# Patient Record
Sex: Male | Born: 1945 | Race: White | Hispanic: No | Marital: Single | Smoking: Never smoker
Health system: Southern US, Community
[De-identification: ages and names within clinical notes are randomized; demographics above are authoritative.]

## PROBLEM LIST (undated history)

## (undated) DIAGNOSIS — E785 Hyperlipidemia, unspecified: Secondary | ICD-10-CM

## (undated) DIAGNOSIS — I252 Old myocardial infarction: Secondary | ICD-10-CM

## (undated) DIAGNOSIS — I255 Ischemic cardiomyopathy: Secondary | ICD-10-CM

## (undated) DIAGNOSIS — T7840XA Allergy, unspecified, initial encounter: Secondary | ICD-10-CM

## (undated) DIAGNOSIS — I2510 Atherosclerotic heart disease of native coronary artery without angina pectoris: Secondary | ICD-10-CM

## (undated) DIAGNOSIS — N400 Enlarged prostate without lower urinary tract symptoms: Secondary | ICD-10-CM

## (undated) HISTORY — DX: Hyperlipidemia, unspecified: E78.5

## (undated) HISTORY — DX: Ischemic cardiomyopathy: I25.5

## (undated) HISTORY — DX: Old myocardial infarction: I25.2

## (undated) HISTORY — DX: Atherosclerotic heart disease of native coronary artery without angina pectoris: I25.10

## (undated) HISTORY — DX: Allergy, unspecified, initial encounter: T78.40XA

## (undated) HISTORY — PX: HERNIA REPAIR: SHX51

---

## 2002-12-30 ENCOUNTER — Encounter: Admission: RE | Admit: 2002-12-30 | Discharge: 2002-12-30 | Payer: Self-pay | Admitting: Emergency Medicine

## 2002-12-30 ENCOUNTER — Encounter: Payer: Self-pay | Admitting: Emergency Medicine

## 2011-09-14 ENCOUNTER — Ambulatory Visit (INDEPENDENT_AMBULATORY_CARE_PROVIDER_SITE_OTHER): Payer: Medicare Other | Admitting: Family Medicine

## 2011-09-14 ENCOUNTER — Inpatient Hospital Stay (HOSPITAL_COMMUNITY)
Admission: AD | Admit: 2011-09-14 | Discharge: 2011-09-16 | DRG: 195 | Disposition: A | Discharge status: Home / Self Care | Payer: Medicare Other | Source: Ambulatory Visit | Attending: Family Medicine | Admitting: Family Medicine

## 2011-09-14 ENCOUNTER — Encounter (HOSPITAL_COMMUNITY): Payer: Self-pay | Admitting: Family Medicine

## 2011-09-14 ENCOUNTER — Ambulatory Visit: Payer: Medicare Other

## 2011-09-14 VITALS — BP 101/66 | HR 90 | Temp 97.5°F | Resp 22 | Ht 67.5 in | Wt 142.6 lb

## 2011-09-14 DIAGNOSIS — Z79899 Other long term (current) drug therapy: Secondary | ICD-10-CM

## 2011-09-14 DIAGNOSIS — R0902 Hypoxemia: Secondary | ICD-10-CM | POA: Diagnosis present

## 2011-09-14 DIAGNOSIS — J189 Pneumonia, unspecified organism: Secondary | ICD-10-CM

## 2011-09-14 DIAGNOSIS — J019 Acute sinusitis, unspecified: Secondary | ICD-10-CM

## 2011-09-14 DIAGNOSIS — N400 Enlarged prostate without lower urinary tract symptoms: Secondary | ICD-10-CM | POA: Diagnosis present

## 2011-09-14 DIAGNOSIS — R0600 Dyspnea, unspecified: Secondary | ICD-10-CM

## 2011-09-14 DIAGNOSIS — J329 Chronic sinusitis, unspecified: Secondary | ICD-10-CM | POA: Diagnosis present

## 2011-09-14 DIAGNOSIS — E860 Dehydration: Secondary | ICD-10-CM | POA: Diagnosis present

## 2011-09-14 HISTORY — DX: Benign prostatic hyperplasia without lower urinary tract symptoms: N40.0

## 2011-09-14 LAB — DIFFERENTIAL
Basophils Relative: 0 % (ref 0–1)
Lymphs Abs: 0.7 10*3/uL (ref 0.7–4.0)
Monocytes Absolute: 1.1 10*3/uL — ABNORMAL HIGH (ref 0.1–1.0)
Monocytes Relative: 8 % (ref 3–12)
Neutro Abs: 12.7 10*3/uL — ABNORMAL HIGH (ref 1.7–7.7)

## 2011-09-14 LAB — COMPREHENSIVE METABOLIC PANEL
ALT: 32 U/L (ref 0–53)
Albumin: 2.7 g/dL — ABNORMAL LOW (ref 3.5–5.2)
Alkaline Phosphatase: 88 U/L (ref 39–117)
BUN: 10 mg/dL (ref 6–23)
Chloride: 97 mEq/L (ref 96–112)
GFR calc Af Amer: >90 mL/min (ref >90)
Glucose, Bld: 105 mg/dL — ABNORMAL HIGH (ref 70–99)
Potassium: 3.2 mEq/L — ABNORMAL LOW (ref 3.5–5.1)
Total Bilirubin: 0.6 mg/dL (ref 0.3–1.2)

## 2011-09-14 LAB — POCT CBC
HCT, POC: 40.4 % — AB (ref 43.5–53.7)
Hemoglobin: 13.2 g/dL — AB (ref 14.1–18.1)
MCHC: 32.7 g/dL (ref 31.8–35.4)
MCV: 92.6 fL (ref 80–97)
MID (cbc): 0.6 (ref 0–0.9)
RBC: 4.36 M/uL — AB (ref 4.69–6.13)
RDW, POC: 13.3 %
WBC: 11.2 10*3/uL — AB (ref 4.6–10.2)

## 2011-09-14 LAB — RAPID HIV SCREEN (WH-MAU): Rapid HIV Screen: NONREACTIVE

## 2011-09-14 LAB — MAGNESIUM: Magnesium: 2 mg/dL (ref 1.5–2.5)

## 2011-09-14 MED ORDER — ALBUTEROL SULFATE (5 MG/ML) 0.5% IN NEBU
2.5000 mg | INHALATION_SOLUTION | RESPIRATORY_TRACT | Status: DC
  Administered 2011-09-14 – 2011-09-16 (×8): 2.5 mg via RESPIRATORY_TRACT
  Filled 2011-09-14 (×9): qty 0.5

## 2011-09-14 MED ORDER — IBUPROFEN 400 MG PO TABS
400.0000 mg | ORAL_TABLET | Freq: Four times a day (QID) | ORAL | Status: DC | PRN
  Filled 2011-09-14: qty 1

## 2011-09-14 MED ORDER — ALBUTEROL SULFATE (2.5 MG/3ML) 0.083% IN NEBU: 2.5000 mg | INHALATION_SOLUTION | Freq: Once | RESPIRATORY_TRACT | Status: DC

## 2011-09-14 MED ORDER — FLUTICASONE PROPIONATE 50 MCG/ACT NA SUSP
2.0000 | Freq: Every day | NASAL | Status: DC
  Administered 2011-09-14 – 2011-09-16 (×3): 2 via NASAL
  Filled 2011-09-14: qty 16

## 2011-09-14 MED ORDER — DEXTROSE 5 % IV SOLN
500.0000 mg | INTRAVENOUS | Status: DC
  Administered 2011-09-14 – 2011-09-15 (×2): 500 mg via INTRAVENOUS
  Filled 2011-09-14 (×3): qty 500

## 2011-09-14 MED ORDER — IPRATROPIUM BROMIDE 0.02 % IN SOLN
0.5000 mg | RESPIRATORY_TRACT | Status: DC
  Administered 2011-09-14 – 2011-09-16 (×8): 0.5 mg via RESPIRATORY_TRACT
  Filled 2011-09-14 (×9): qty 2.5

## 2011-09-14 MED ORDER — CLONAZEPAM 1 MG PO TABS: 1.0000 mg | ORAL_TABLET | Freq: Every evening | ORAL | Status: DC | PRN

## 2011-09-14 MED ORDER — ALBUTEROL SULFATE (2.5 MG/3ML) 0.083% IN NEBU
2.5000 mg | INHALATION_SOLUTION | Freq: Once | RESPIRATORY_TRACT | Status: AC
  Administered 2011-09-14 (×2): 2.5 mg via RESPIRATORY_TRACT

## 2011-09-14 MED ORDER — ALBUTEROL SULFATE (5 MG/ML) 0.5% IN NEBU: 2.5000 mg | INHALATION_SOLUTION | RESPIRATORY_TRACT | Status: DC | PRN

## 2011-09-14 MED ORDER — AMPHETAMINE-DEXTROAMPHETAMINE 15 MG PO TABS
15.0000 mg | ORAL_TABLET | Freq: Three times a day (TID) | ORAL | Status: DC
  Filled 2011-09-14 (×7): qty 1

## 2011-09-14 MED ORDER — LORATADINE 10 MG PO TABS
10.0000 mg | ORAL_TABLET | Freq: Every day | ORAL | Status: DC
  Administered 2011-09-14 – 2011-09-16 (×3): 10 mg via ORAL
  Filled 2011-09-14 (×3): qty 1

## 2011-09-14 MED ORDER — TERAZOSIN HCL 2 MG PO CAPS
2.0000 mg | ORAL_CAPSULE | Freq: Every day | ORAL | Status: DC
  Administered 2011-09-14 – 2011-09-15 (×2): 2 mg via ORAL
  Filled 2011-09-14 (×3): qty 1

## 2011-09-14 MED ORDER — GUAIFENESIN ER 600 MG PO TB12
600.0000 mg | ORAL_TABLET | Freq: Two times a day (BID) | ORAL | Status: DC
  Administered 2011-09-14 – 2011-09-16 (×4): 600 mg via ORAL
  Filled 2011-09-14 (×5): qty 1

## 2011-09-14 MED ORDER — SODIUM CHLORIDE 0.9 % IV SOLN
INTRAVENOUS | Status: DC
  Administered 2011-09-14 – 2011-09-15 (×2): via INTRAVENOUS

## 2011-09-14 MED ORDER — CEFTRIAXONE SODIUM 1 G IJ SOLR
1.0000 g | INTRAMUSCULAR | Status: DC
  Administered 2011-09-14 – 2011-09-15 (×2): 1 g via INTRAVENOUS
  Filled 2011-09-14 (×3): qty 10

## 2011-09-14 NOTE — Progress Notes (Signed)
  Subjective: Patient has been having a lot of trouble with upper respiratory congestion for 10 days. He has not been febrile. He had bad sinus symptoms have gotten progressively weaker. More cough. Coughing up some phlegm. Just feels weak and tired. Sleeping a lot.  Has never been hospitalized. It is usually quite healthy. He worked various jobs throughout his life. Has no children, has a regular girlfriend. Does not smoke. Exercises. History of Wellbutrin allergy.  Objective: TMs have fluid behind both drums, left more clear than right. Neck supple without nodes. Chest has some decreased breath sounds bilaterally and a few crackles at the bases. Not much in the way of wheezes. He does look short of breath. Splinting himself. Sitting up to breathe. It was clear. No edema.  Results for orders placed in visit on 09/14/11  POCT CBC      Component Value Range   WBC 11.2 (*) 4.6 - 10.2 (K/uL)   Lymph, poc 1.6  0.6 - 3.4    POC LYMPH PERCENT 14.4  10 - 50 (%L)   MID (cbc) 0.6  0 - 0.9    POC MID % 5.7  0 - 12 (%M)   POC Granulocyte 8.9 (*) 2 - 6.9    Granulocyte percent 79.9  37 - 80 (%G)   RBC 4.36 (*) 4.69 - 6.13 (M/uL)   Hemoglobin 13.2 (*) 14.1 - 18.1 (g/dL)   HCT, POC 40.9 (*) 81.1 - 53.7 (%)   MCV 92.6  80 - 97 (fL)   MCH, POC 30.3  27 - 31.2 (pg)   MCHC 32.7  31.8 - 35.4 (g/dL)   RDW, POC 91.4     Platelet Count, POC 443 (*) 142 - 424 (K/uL)   MPV 8.4  0 - 99.8 (fL)   [facility] reading (PRIMARY) by  Dr. Alwyn Ren chest x-ray is abnormal. Increased retrosternal air space, and abnormal right lower lung raise some suspicion of a pneumothorax or some other abnormal air trapping.  Assessment sinusitis, serous otitis, and asthma.  Plan await radiology chest x-ray reading. Give patient a breathing treatment. His O2 sat is only 88%.  Sat persists 87-88 despite albuterol neb. . Assessment: Pneumonia Hypoxemia Sinusitis  Patient will be sent to the hospital on 02. He needs admission. Will  start IV and O2.  Spoke to Dr.Konkol counting teaching service who will try and get him a bed. Will need to send him by EMS non-emergent transfer.

## 2011-09-14 NOTE — H&P (Signed)
Dustin Gardner is an 66 y.o. male.    PCP: [facility] Chief Complaint: shortness of breath, fatigue  HPI: A 66 year old male with history of sinusitis presents with 10 days of sinus congestion and productive cough. Approximately 7 days ago patient began to experience some dyspnea and worsening of cough. Also having some chills, fatigue, poor oral intake, and headaches. Presented to urgent care at [facility] today and found to have a mild resting hypoxia of 87-88% on room air and persisted post nebulizer treatment. Chest x-ray concerning for lower lobe pneumonia and leukocytosis of 12. Patient stabilized to 93% on 2 L oxygen. Transferred to [facility] for admission due to increase work of breathing and hypoxia.  Review of systems. Patient endorses fatigue, chills, lightheadedness. Denies fever, edema, rash, visual changes, wheezing, chest pain, constipation, diarrhea, urinary problems.  Past Medical History  Diagnosis Date  . BPH (benign prostatic hyperplasia)   . Chronic sinusitis     Past Surgical History  Procedure Date  . Hernia repair   knee surgery 1979  Family History  Problem Relation Age of Onset  . Cancer Mother     lung   Social History:  reports that he has never smoked. He does not have any smokeless tobacco history on file. He reports that he drinks about 7 ounces of alcohol per week. He reports that he does not use illicit drugs.  Allergies:  Allergies  Allergen Reactions  . Wellbutrin (Bupropion Hcl) Rash    Medications Prior to Admission  Medication Dose Route Frequency Provider Last Rate Last Dose  . 0.9 %  sodium chloride infusion   Intravenous Continuous Lloyd Huger, MD      . albuterol (PROVENTIL) (5 MG/ML) 0.5% nebulizer solution 2.5 mg  2.5 mg Nebulization Q4H PRN Lloyd Huger, MD      . ipratropium (ATROVENT) nebulizer solution 0.5 mg  0.5 mg Nebulization Q4H Lloyd Huger, MD       And  . albuterol (PROVENTIL) (5 MG/ML) 0.5% nebulizer solution 2.5 mg  2.5 mg  Nebulization Q4H Lloyd Huger, MD      . amphetamine-dextroamphetamine (ADDERALL) tablet 15 mg  15 mg Oral TID Lloyd Huger, MD      . azithromycin (ZITHROMAX) 500 mg in dextrose 5 % 250 mL IVPB  500 mg Intravenous Q24H Lloyd Huger, MD      . cefTRIAXone (ROCEPHIN) 1 g in dextrose 5 % 50 mL IVPB  1 g Intravenous Q24H Lloyd Huger, MD      . clonazePAM (KLONOPIN) tablet 1 mg  1 mg Oral QHS PRN Lloyd Huger, MD      . fluticasone (FLONASE) 50 MCG/ACT nasal spray 2 spray  2 spray Each Nare Daily Lloyd Huger, MD      . guaiFENesin (MUCINEX) 12 hr tablet 600 mg  600 mg Oral BID Lloyd Huger, MD      . ibuprofen (ADVIL,MOTRIN) tablet 400 mg  400 mg Oral Q6H PRN Lloyd Huger, MD      . loratadine (CLARITIN) tablet 10 mg  10 mg Oral Daily Lloyd Huger, MD      . terazosin (HYTRIN) capsule 2 mg  2 mg Oral QHS Lloyd Huger, MD       No current outpatient prescriptions on file as of 09/14/2011.    Results for orders placed in visit on 09/14/11 (from the past 48 hour(s))  POCT CBC     Status: Abnormal   Collection Time   09/14/11  3:41 PM  Component Value Range Comment   WBC 11.2 (*) 4.6 - 10.2 (K/uL)    Lymph, poc 1.6  0.6 - 3.4     POC LYMPH PERCENT 14.4  10 - 50 (%L)    MID (cbc) 0.6  0 - 0.9     POC MID % 5.7  0 - 12 (%M)    POC Granulocyte 8.9 (*) 2 - 6.9     Granulocyte percent 79.9  37 - 80 (%G)    RBC 4.36 (*) 4.69 - 6.13 (M/uL)    Hemoglobin 13.2 (*) 14.1 - 18.1 (g/dL)    HCT, POC 16.1 (*) 09.6 - 53.7 (%)    MCV 92.6  80 - 97 (fL)    MCH, POC 30.3  27 - 31.2 (pg)    MCHC 32.7  31.8 - 35.4 (g/dL)    RDW, POC 04.5      Platelet Count, POC 443 (*) 142 - 424 (K/uL)    MPV 8.4  0 - 99.8 (fL)    Dg Chest 2 View  09/14/2011  OVERREAD BY [facility] *RADIOLOGY REPORT*  Clinical Data: Shortness of breath.  CHEST - 2 VIEW  Comparison: None.  Findings: There is mild patient rotation to the left.  There is a calcified right hilar lymph node.  There is basilar air space disease on the lateral  view with partial obscuration of the hemidiaphragm posteriorly.  Based on the frontal examination, there may be involvement of both lower lobes.  No significant pleural effusion is identified.  On the lateral view, there is a probable small retrosternal granuloma superiorly.  No acute osseous findings are seen. There is no evidence of pneumothorax.  IMPRESSION:  1.  Bilateral lower lobe air space opacities concerning for pneumonia.  Radiographic followup is recommended to document clearing. 2.  Evidence of prior granulomatous disease with calcified right hilar lymph node and upper lobe granuloma.  Original Report Authenticated By: Gerrianne Scale, M.D.    ROS  Blood pressure 158/83, pulse 109, temperature 99.2 F (37.3 C), temperature source Oral, resp. rate 20, height 5\' 7"  (1.702 m), weight 144 lb (65.318 kg), SpO2 92.00%. Physical Exam  Vitals reviewed. Constitutional: He is oriented to person, place, and time. He appears well-developed and well-nourished.       Mild increase work of breathing. Pauses every 5-6 words.  HENT:  Head: Normocephalic and atraumatic.  Mouth/Throat: No oropharyngeal exudate.       Dry mm.  Eyes: EOM are normal. Pupils are equal, round, and reactive to light.  Cardiovascular: Regular rhythm, normal heart sounds and intact distal pulses.   No murmur heard.      Tachycardia  Respiratory: He is in respiratory distress. He has wheezes.       Tachypnea, Mild abdominal breathing and supraclavicular retraction. Mildly diminshed BS in bases with rare expiratory wheeze.   GI: Soft. There is no tenderness.  Musculoskeletal: He exhibits no edema and no tenderness.  Neurological: He is alert and oriented to person, place, and time. No cranial nerve deficit. He exhibits normal muscle tone. Coordination normal.  Skin: No rash noted.  Psychiatric: He has a normal mood and affect.     Assessment/Plan A 66 year old male with community-acquired pneumonia.  1. Pneumonia.  Chest x-ray concerning for a lower lobe involvement. Resting hypoxia and leukocytosis. Given diffuse diminished breath sounds and some evidence of bronchospasm Will treat for typical and atypical organisms with Rocephin and azithromycin IV. Duonebs every 4 hours scheduled. Will reassess  her after initiation of antibiotics and consider starting systemic steroids if bronchospasm persists. Mucinex for cough. Blood cultures and urinary antigens ordered.   2. Tachycardia. Likely effects of albuterol and possible mild dehydration. Will start on normal saline at 50 cc per hour and allow diet as patient is hungry. EKG in a.m. No known cardiac history  3. BPH. Continue home med terazosyn.  4. FEN. Regular diet and normal saline at 50 cc per hour. Electrolytes ordered.  5. PPX. SCDs. Endorses 2 alcoholic beverages per night but denies history of withdrawal and only drinking infrequently this past week. Low likelihood of withdrawal but will monitor.  6. Dispo. Pending clinical course. Admit to telemetry tonight. Patient is full code status.   Dameion Briles 09/14/2011, 8:40 PM PGY-2  Pager [phone]

## 2011-09-14 NOTE — Progress Notes (Signed)
  Subjective:    Patient ID: Dustin Gardner, male    DOB: 1946-02-09, 66 y.o.   MRN: [id number]  HPI    Review of Systems     Objective:   Physical Exam        Assessment & Plan:

## 2011-09-15 ENCOUNTER — Other Ambulatory Visit: Payer: Self-pay

## 2011-09-15 ENCOUNTER — Encounter: Payer: Self-pay | Admitting: Radiology

## 2011-09-15 DIAGNOSIS — J159 Unspecified bacterial pneumonia: Secondary | ICD-10-CM

## 2011-09-15 LAB — COMPREHENSIVE METABOLIC PANEL
ALT: 37 U/L (ref 0–53)
AST: 31 U/L (ref 0–37)
Alkaline Phosphatase: 73 U/L (ref 39–117)
BUN: 13 mg/dL (ref 6–23)
Calcium: 9.1 mg/dL (ref 8.4–10.5)
Chloride: 99 mEq/L (ref 96–112)
Glucose, Bld: 105 mg/dL — ABNORMAL HIGH (ref 70–99)
Potassium: 3.6 mEq/L (ref 3.5–5.3)
Sodium: 138 mEq/L (ref 135–145)
Total Bilirubin: 0.8 mg/dL (ref 0.3–1.2)

## 2011-09-15 LAB — CBC
HCT: 31.9 % — ABNORMAL LOW (ref 39.0–52.0)
Hemoglobin: 11.2 g/dL — ABNORMAL LOW (ref 13.0–17.0)
MCH: 31.2 pg (ref 26.0–34.0)
MCHC: 35.1 g/dL (ref 30.0–36.0)
MCV: 88.9 fL (ref 78.0–100.0)

## 2011-09-15 LAB — LEGIONELLA ANTIGEN, URINE

## 2011-09-15 LAB — STREP PNEUMONIAE URINARY ANTIGEN: Strep Pneumo Urinary Antigen: NEGATIVE

## 2011-09-15 MED ORDER — POTASSIUM CHLORIDE 20 MEQ PO PACK
40.0000 meq | PACK | ORAL | Status: DC
  Filled 2011-09-15 (×2): qty 2

## 2011-09-15 MED ORDER — POTASSIUM CHLORIDE CRYS ER 20 MEQ PO TBCR
40.0000 meq | EXTENDED_RELEASE_TABLET | ORAL | Status: AC
  Administered 2011-09-15: 40 meq via ORAL
  Filled 2011-09-15: qty 2

## 2011-09-15 MED ORDER — GUAIFENESIN 200 MG PO TABS
200.0000 mg | ORAL_TABLET | ORAL | Status: DC | PRN
  Filled 2011-09-15: qty 1

## 2011-09-15 NOTE — Progress Notes (Signed)
FMTS Attending Daily Note: Krystle Polcyn MD [phone] pager office [phone] I have discussed this patient with the resident and reviewed the assessment and plan as documented above. I agree wit the resident's findings and plan.  

## 2011-09-15 NOTE — Progress Notes (Signed)
Family Medicine Teaching Service Daily Resident Note   Patient name: Dustin Gardner Medical record number: [id number] Date of birth: 11-Aug-1945 Age: 66 y.o. Gender: male Length of Stay:  LOS: 1 day             SUBJECTIVE & OVERNIGHT EVENTS  Pt reports that cough and dyspnea improved but still significant.             OBJECTIVE  Vitals: Patient Vitals for the past 24 hrs:  BP Temp Temp src Pulse Resp SpO2 Height Weight  09/15/11 1340 124/73 mmHg 97.7 F (36.5 C) Oral 91  20  93 % - -  09/15/11 0742 - - - - - 93 % - -  09/15/11 0544 108/65 mmHg 98.8 F (37.1 C) - 98  20  92 % - -  09/14/11 2144 117/68 mmHg 99.7 F (37.6 C) - 101  20  93 % - -  09/14/11 1847 158/83 mmHg 99.2 F (37.3 C) Oral 109  20  92 % 5\' 7"  (1.702 m) 144 lb (65.318 kg)   Wt Readings from Last 3 Encounters:  09/14/11 144 lb (65.318 kg)  09/14/11 142 lb 9.6 oz (64.683 kg)    Intake/Output Summary (Last 24 hours) at 09/15/11 1432 Last data filed at 09/15/11 1300  Gross per 24 hour  Intake   1240 ml  Output    601 ml  Net    639 ml    PE: GENERAL:  Adult Caucasian Male, examined in [facility] 5500.  In no discomfort, mild resp distress with slight conversational dyspnea.  Able to maintain ~10 words without O2 in place. HNEENT: AT/[place], MMM, no scleral icterus, EOMi THORAX: HEART: RRR, S1/S2 heard, no murmur LUNGS: Decreased breath sounds in B bases, Slight end expiratory wheezing, no tactile fremitus ABDOMEN:  +BS, soft, non-tender, no rigidity, no guarding, no masses/organomegaly EXTREMITIES: Moves all 4 extremities spontaneously, warm well perfused, no edema, bilateral DP and PT pulses 2/4.    LABS: Hematology:  Lab 09/15/11 0548 09/14/11 1541  WBC 15.2* 11.2*  HGB 11.2* 13.2*  HCT 31.9* 40.4*  PLT 395 --  RDW 12.8 --  MCV 88.9 92.6  MCHC 35.1 32.7    Lab 09/14/11 2045  LYMPHSABS 0.7  EOSABS 0.0  BASOSABS 0.0    Metabolic:  Lab 09/14/11 2045 09/14/11 1500  NA 132* 138  K 3.2* 3.6  CL 97 99    CO2 21 24  BUN 10 13  CREATININE 0.74 0.86  GLUCOSE 105* 105*  CALCIUM 8.5 9.1  MG 2.0 --  PHOS 2.7 --    Lab 09/14/11 2045 09/14/11 1500  AST 23 31  ALKPHOS 88 73  BILITOT 0.6 0.8  BILIDIR -- --  PROT 6.9 7.1  ALBUMIN 2.7* 3.8  PREALBUMIN -- --   PENDING LABS:   MICRO: 3/7: Blood Cx - NGTD HIV Screen - Negative Urine Strep - Negative Urine Legionella - negative  IMAGING: None new  MEDS:    . ipratropium  0.5 mg Nebulization Q4H   And  . albuterol  2.5 mg Nebulization Q4H  . amphetamine-dextroamphetamine  15 mg Oral TID  . azithromycin  500 mg Intravenous Q24H  . cefTRIAXone (ROCEPHIN)  IV  1 g Intravenous Q24H  . fluticasone  2 spray Each Nare Daily  . guaiFENesin  600 mg Oral BID  . loratadine  10 mg Oral Daily  . potassium chloride  40 mEq Oral Q4H  . terazosin  2 mg Oral QHS  .  DISCONTD: potassium chloride  40 mEq Oral Q4H   albuterol, clonazePAM, guaiFENesin, ibuprofen            ASSESSMENT & PLAN  A 66 year old male with community-acquired pneumonia.  1. RESP (Community Acquired Pneumonia, Hypoxia)  *Fluticasone *Albuterol + Ipratropium *Guaifenesin *Loratadine - RESP toilet - Continue to provide Story O2 to maintain O2 > 92% as no underlying COPD and remains symptomatic - ABX as below  2. ID (CAP, Leukocytosis) - Remains afebrile *Azithromycin *Rocephin Plan to de-esclate to Moxifloxacin Blood Cx NGTD and Urinary antigens negative  3. CV (Tachycardia)  - likely secondary to mild dehydration and albuterol - improved overnight  4. GU (BPH) *Terazosyn  5. PSYCH *Clonazepam prn  --- IVFs: NS 31ml/hr --- DIET: Regular  --- PPx: SCDs            DISPOSITION  Will continue to observe overnight as pt still requring O2.  Will plan for D/c in AM if clinically improved.    Andrena Mews, DO [facility] Resident - PGY-1 09/15/2011 2:32 PM

## 2011-09-15 NOTE — H&P (Signed)
Family Medicine Teaching Service Attending Note  I interviewed and examined patient Enderle and reviewed their tests and x-rays.  I discussed with Dr. Cristal Ford and reviewed their note for today.  I agree with their assessment and plan.     Additionally  This AM was resting comfortably wearing 02 Consistent with a CAP.  Treat inpatient until able to oxyenate off O2 and VS are stable.   If he remains hypoxic for longer than 1-2 days we may need to investigate other causes (chronic lung disease, pulmonary emboli etc)

## 2011-09-15 NOTE — Progress Notes (Signed)
UR completed. Hoy Fallert RNBSN 

## 2011-09-16 ENCOUNTER — Inpatient Hospital Stay (HOSPITAL_COMMUNITY): Payer: Medicare Other

## 2011-09-16 MED ORDER — MOXIFLOXACIN HCL 400 MG PO TABS: 400.0000 mg | ORAL_TABLET | Freq: Every day | ORAL | Status: AC

## 2011-09-16 NOTE — Discharge Summary (Signed)
Family Medicine Teaching Service  Discharge Note : Attending Makaila Windle MD Pager [phone] Office [phone] I have seen and examined this patient, reviewed their chart and discussed discharge planning wit the resident at the time of discharge. I agree with the discharge plan as above.  

## 2011-09-16 NOTE — Discharge Summary (Signed)
Physician Discharge Summary  Patient ID: Dustin Gardner MRN: [id number] DOB/AGE: 1945-07-18 66 y.o.  Admit date: 09/14/2011 Discharge date: 09/16/2011  Admission Diagnoses:  Discharge Diagnoses:  Principal Problem:  *CAP (community acquired pneumonia)   Discharged Condition: good, improved  Hospital Course:  66 y/o c/m, with history of sinusitis presented with 10 days of sinus congestion and productive cough. Approximately 7 days ago patient began to experience some dyspnea and worsening of cough. Also having some chills, fatigue, poor oral intake, and headaches. Presented to urgent care at [facility] and found to have a mild resting hypoxia of 87-88% on room air and persisted post nebulizer treatment. Chest x-ray concerning for lower lobe pneumonia and leukocytosis of 12. Patient stabilized to 93% on 2 L oxygen. Transferred to [facility] for admission due to increase work of breathing and hypoxia. Found to have pneumonia on chest xray in bilateral lower lobes. His WBC was 15 at discharge. He did not have any fever or vital instability. Patient had no SOB or breathing difficulty. He was off oxygen the day of discharge, lowest 02 was 87%. Patient did have a cough at discharge. He felt better and was in stable at home condition for discharge.  Patient also received IV fluids for dehydration, which clinically resolved.  Consults: None  Significant Diagnostic Studies: radiology: CXR: infiltrates: lower lobe bilaterally Lab Results  Component Value Date   WBC 15.2* 09/15/2011   HGB 11.2* 09/15/2011   HCT 31.9* 09/15/2011   MCV 88.9 09/15/2011   PLT 395 09/15/2011    Treatments: IV hydration and antibiotics: ceftriaxone and azithromycin  Discharge Exam: Blood pressure 100/64, pulse 86, temperature 98.8 F (37.1 C), temperature source Oral, resp. rate 20, height 5\' 7"  (1.702 m), weight 144 lb (65.318 kg), SpO2 90.00%. General appearance: alert, cooperative, appears stated age and no distress Resp: diminished  breath sounds LLL and RLL Cardio: regular rate and rhythm, S1, S2 normal, no murmur, click, rub or gallop Extremities: extremities normal, atraumatic, no cyanosis or edema Skin: Skin color, texture, turgor normal. No rashes or lesions  Disposition: Home with help from friend.    Medication List  As of 09/16/2011 11:40 AM   TAKE these medications         amphetamine-dextroamphetamine 15 MG tablet   Commonly known as: ADDERALL   Take 15 mg by mouth 3 (three) times daily.      clonazePAM 1 MG tablet   Commonly known as: KLONOPIN   Take 1 mg by mouth at bedtime as needed.      fluticasone 50 MCG/ACT nasal spray   Commonly known as: FLONASE   Place 2 sprays into the nose daily.      guaiFENesin 600 MG 12 hr tablet   Commonly known as: MUCINEX   Take 600 mg by mouth 2 (two) times daily.      ibuprofen 200 MG tablet   Commonly known as: ADVIL,MOTRIN   Take 400 mg by mouth every 6 (six) hours as needed. For pain      loratadine 10 MG tablet   Commonly known as: CLARITIN   Take 10 mg by mouth daily.      moxifloxacin 400 MG tablet   Commonly known as: AVELOX   Take 1 tablet (400 mg total) by mouth daily.      sodium chloride 0.65 % nasal spray   Commonly known as: OCEAN   Place 1 spray into the nose as needed. For congestion      terazosin  2 MG capsule   Commonly known as: HYTRIN   Take 2 mg by mouth at bedtime.           Follow-up Information    Schedule an appointment as soon as possible for a visit with HOPPER,DAVID, MD.   Contact information:   [address] [place] [address] [phone]          Signed: Orson Gear 09/16/2011, 11:40 AM

## 2011-09-16 NOTE — Progress Notes (Signed)
Patient being discharged today. IV site removed. Belongings bagged for discharge. Discharge instructions reviewed with patient. Taylan Marez,RN.

## 2011-09-18 ENCOUNTER — Telehealth: Payer: Self-pay

## 2011-09-18 NOTE — Telephone Encounter (Signed)
Dr Alwyn Ren saw the patient last and sent him to the hospital for pneumonia.  Patient was told to follow up with our office and wants to see Dr. Alwyn Ren, however he does not want to see him at the walk in center and would like to talk to Dr. Alwyn Ren and our medical director.

## 2011-09-18 NOTE — Telephone Encounter (Signed)
Patient can go to the appointment Center and see either Dr. Albin Fischer or one of the PAs, but I do not have any appointment hours. If he wishes to see me he has to come back in here and ask for me.  I am sorry, but that is my schedule.

## 2011-09-18 NOTE — Telephone Encounter (Signed)
PLEASE ADVISE.

## 2011-09-19 NOTE — Telephone Encounter (Signed)
LMOM notifying patient message.  CB if ?'s.

## 2011-09-20 ENCOUNTER — Ambulatory Visit (INDEPENDENT_AMBULATORY_CARE_PROVIDER_SITE_OTHER): Payer: Medicare Other | Admitting: Family Medicine

## 2011-09-20 VITALS — BP 94/64 | HR 93 | Temp 98.0°F | Resp 16 | Ht 67.0 in | Wt 141.4 lb

## 2011-09-20 DIAGNOSIS — J310 Chronic rhinitis: Secondary | ICD-10-CM

## 2011-09-20 DIAGNOSIS — J189 Pneumonia, unspecified organism: Secondary | ICD-10-CM

## 2011-09-20 DIAGNOSIS — R5381 Other malaise: Secondary | ICD-10-CM

## 2011-09-20 DIAGNOSIS — R5383 Other fatigue: Secondary | ICD-10-CM

## 2011-09-20 DIAGNOSIS — J159 Unspecified bacterial pneumonia: Secondary | ICD-10-CM

## 2011-09-20 DIAGNOSIS — N400 Enlarged prostate without lower urinary tract symptoms: Secondary | ICD-10-CM

## 2011-09-20 LAB — CULTURE, BLOOD (ROUTINE X 2)

## 2011-09-20 MED ORDER — TERAZOSIN HCL 2 MG PO CAPS: 2.0000 mg | ORAL_CAPSULE | Freq: Every day | ORAL | Status: DC

## 2011-09-20 MED ORDER — FLUTICASONE PROPIONATE 50 MCG/ACT NA SUSP: 2.0000 | Freq: Every day | NASAL | Status: DC

## 2011-09-20 NOTE — Progress Notes (Signed)
Subjective: Patient was hospitalized at [facility] with a pneumonia last week. He is doing better though still short of breath. We talked about how it takes time to recover. His O2 saturation today was 93%. He asked that he get home O2, and told him he would not qualify. He asked if it would make him get well quicker, told him it should not make a difference in the recovery time at this point he needs to continue taking his antibiotics.  Objective:  Still looks weak and a little short of breath. Throat clear. Neck supple without nodes. Chest is slightly congested sounding but no clearcut rales rhonchi or wheezes. Heart regular without murmurs.  Assessment: Pneumonia, partially treated Post illness weakness  Plan: At his questions. We'll see him back in followup as needed or in about a month.  In trying to send a letter to Dr. Madaline Guthrie per his request.

## 2011-09-20 NOTE — Progress Notes (Deleted)
  Subjective:    Patient ID: Dustin Gardner, male    DOB: 12/03/1945, 65 y.o.   MRN: [id number]  HPI    Review of Systems     Objective:   Physical Exam        Assessment & Plan:   

## 2011-09-20 NOTE — Patient Instructions (Signed)
Continue to gradually increase her exercise. I encouraged deep breathing and coughing out of the phlegm.  If more shortness of breath or fevers recur come on in right away.  If the  general weakness has not improved over the next couple of weeks come back for a recheck.

## 2011-09-21 LAB — CULTURE, BLOOD (ROUTINE X 2)
Culture  Setup Time: 201303080236
Culture: NO GROWTH

## 2012-10-09 ENCOUNTER — Ambulatory Visit (INDEPENDENT_AMBULATORY_CARE_PROVIDER_SITE_OTHER): Payer: Medicare Other | Admitting: Family Medicine

## 2012-10-09 VITALS — BP 128/64 | HR 69 | Temp 97.3°F | Resp 16 | Ht 66.75 in | Wt 144.2 lb

## 2012-10-09 DIAGNOSIS — Z0000 Encounter for general adult medical examination without abnormal findings: Secondary | ICD-10-CM

## 2012-10-09 DIAGNOSIS — J309 Allergic rhinitis, unspecified: Secondary | ICD-10-CM

## 2012-10-09 DIAGNOSIS — F909 Attention-deficit hyperactivity disorder, unspecified type: Secondary | ICD-10-CM

## 2012-10-09 DIAGNOSIS — Z860100 Personal history of colon polyps, unspecified: Secondary | ICD-10-CM

## 2012-10-09 DIAGNOSIS — J310 Chronic rhinitis: Secondary | ICD-10-CM

## 2012-10-09 DIAGNOSIS — N400 Enlarged prostate without lower urinary tract symptoms: Secondary | ICD-10-CM

## 2012-10-09 LAB — POCT CBC
HCT, POC: 42 % — AB (ref 43.5–53.7)
Lymph, poc: 1.5 (ref 0.6–3.4)
MCH, POC: 30.3 pg (ref 27–31.2)
MCHC: 31.9 g/dL (ref 31.8–35.4)
MCV: 95.1 fL (ref 80–97)
MID (cbc): 0.3 (ref 0–0.9)
POC LYMPH PERCENT: 33.5 %L (ref 10–50)
Platelet Count, POC: 210 10*3/uL (ref 142–424)
RDW, POC: 13.3 %
WBC: 4.4 10*3/uL — AB (ref 4.6–10.2)

## 2012-10-09 LAB — LIPID PANEL
Cholesterol: 183 mg/dL (ref 0–200)
Total CHOL/HDL Ratio: 3.3 Ratio
Triglycerides: 60 mg/dL (ref <150)
VLDL: 12 mg/dL (ref 0–40)

## 2012-10-09 LAB — COMPREHENSIVE METABOLIC PANEL
CO2: 28 mEq/L (ref 19–32)
Calcium: 9.3 mg/dL (ref 8.4–10.5)
Chloride: 107 mEq/L (ref 96–112)
Creat: 1.02 mg/dL (ref 0.50–1.35)
Glucose, Bld: 102 mg/dL — ABNORMAL HIGH (ref 70–99)
Total Bilirubin: 0.6 mg/dL (ref 0.3–1.2)

## 2012-10-09 MED ORDER — FLUTICASONE PROPIONATE 50 MCG/ACT NA SUSP: 2.0000 | Freq: Every day | NASAL | Status: DC

## 2012-10-09 MED ORDER — TERAZOSIN HCL 2 MG PO CAPS: 2.0000 mg | ORAL_CAPSULE | Freq: Every day | ORAL | Status: DC

## 2012-10-09 NOTE — Progress Notes (Signed)
Annual Exam:  Medical history  67 year old man is here for a physical exam for her annual check. He has been doing fairly well. No major acute complaints. He needs medicine refill.  Past medical history Medication allergies: Wellbutrin which caused a rash Current medications: Flonase daily [place] and 2 mg daily for prostate Clonazepam 1 mg daily Adderall 10 mg daily Claritin OTC one daily  Past medical history: Allergies History of pneumonia Attention deficit disorder  Past surgical history Hernia repair Knee surgery both parents are deceased. Mother with lung cancer and father with heart disease. He has one sister living, one brother who does not discuss his health, and one brother who said surgery on his aorta, gallbladder, and hernia.  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 He has had one sexual partner in the last year. Does not smoke. Drinks alcohol one to 2 drinks daily. Does not use any drugs.  Preventive history: He had a colonoscopy 5 or 6 years ago, with a small polyp was present. Discussed need for going in and get it repeated sometime soon. His PSA has been normal in the past. He has had a last year.  Review of systems: Constitutional: Unremarkable HEENT: Unremarkable except for chronic allergic rhinitis. Also wears glasses. Respiratory: Unremarkable Cardiovascular: Unremarkable Gastrointestinal: Unremarkable Genitourinary: Unremarkable. BPH symptoms seem controlled Skeletal: Unremarkable Dermatologic: Unremarkable Neurologic: Unremarkable Hematologic: Unremarkable Psychiatric: Unremarkable. He is followed by a psychiatrist, Dr. Madaline Guthrie, tried psychiatric, [address], [place] Endocrine: Unremarkable  Physical examination: Well-developed well-nourished  gentleman in no major distress. Wears glasses. HEENT. TMs normal, eyes PERRLA, throat clear, neck supple without nodes thyromegaly. No carotid bruits. Chest is clear to auscultation. Heart regular without murmurs gallops or arrhythmias. Abdomen soft without mass or tenderness. He has a little bit of a bulge of the right inguinal region. Normal male external genitalia with no definite hernias. Digital rectal exam reveals a modest is prostate gland with no nodules. Stool smear was done for him sure. The rectal is otherwise normal. Extremities unremarkable.  Assessment: Physical examination: Normal Allergic rhinitis BPH ADHD History of knee surgery History pneumonia History of colon polyps  Plan: Recommended he get his colonoscopy We'll check labs today.   Results for orders placed in visit on 10/09/12  POCT CBC      Result Value Range   WBC 4.4 (*) 4.6 - 10.2 K/uL   Lymph, poc 1.5  0.6 - 3.4   POC LYMPH PERCENT 33.5  10 - 50 %L   MID (cbc) 0.3  0 - 0.9   POC MID % 6.6  0 - 12 %M   POC Granulocyte 2.6  2 - 6.9   Granulocyte percent 59.9  37 - 80 %G   RBC 4.42 (*) 4.69 - 6.13 M/uL   Hemoglobin 13.4 (*) 14.1 - 18.1 g/dL   HCT, POC 14.7 (*) 82.9 - 53.7 %   MCV 95.1  80 - 97 fL   MCH, POC 30.3  27 - 31.2 pg   MCHC 31.9  31.8 - 35.4 g/dL   RDW, POC 56.2     Platelet Count, POC 210  142 - 424 K/uL   MPV 8.6  0 - 99.8 fL  IFOBT (OCCULT BLOOD)      Result Value Range   IFOBT Negative  POCT GLYCOSYLATED HEMOGLOBIN (HGB A1C)      Result Value Range   Hemoglobin A1C 5.1

## 2012-10-10 ENCOUNTER — Encounter: Payer: Self-pay | Admitting: Radiology

## 2014-09-02 ENCOUNTER — Ambulatory Visit (INDEPENDENT_AMBULATORY_CARE_PROVIDER_SITE_OTHER): Payer: PPO | Admitting: Emergency Medicine

## 2014-09-02 VITALS — BP 120/68 | HR 70 | Temp 98.0°F | Resp 16 | Ht 66.0 in | Wt 143.0 lb

## 2014-09-02 DIAGNOSIS — N400 Enlarged prostate without lower urinary tract symptoms: Secondary | ICD-10-CM

## 2014-09-02 MED ORDER — TERAZOSIN HCL 2 MG PO CAPS: 2.0000 mg | ORAL_CAPSULE | Freq: Every day | ORAL | Status: AC

## 2014-09-02 NOTE — Patient Instructions (Signed)
Benign Prostatic Hyperplasia An enlarged prostate (benign prostatic hyperplasia) is common in older men. You may experience the following:  Weak urine stream.  Dribbling.  Feeling like the bladder has not emptied completely.  Difficulty starting urination.  Getting up frequently at night to urinate.  Urinating more frequently during the day. HOME CARE INSTRUCTIONS  Monitor your prostatic hyperplasia for any changes. The following actions may help to alleviate any discomfort you are experiencing:  Give yourself time when you urinate.  Stay away from alcohol.  Avoid beverages containing caffeine, such as coffee, tea, and colas, because they can make the problem worse.  Avoid decongestants, antihistamines, and some prescription medicines that can make the problem worse.  Follow up with your health care provider for further treatment as recommended. SEEK MEDICAL CARE IF:  You are experiencing progressive difficulty voiding.  Your urine stream is progressively getting narrower.  You are awaking from sleep with the urge to void more frequently.  You are constantly feeling the need to void.  You experience loss of urine, especially in small amounts. SEEK IMMEDIATE MEDICAL CARE IF:   You develop increased pain with urination or are unable to urinate.  You develop severe abdominal pain, vomiting, a high fever, or fainting.  You develop back pain or blood in your urine. MAKE SURE YOU:   Understand these instructions.  Will watch your condition.  Will get help right away if you are not doing well or get worse. Document Released: 06/26/2005 Document Revised: 02/26/2013 Document Reviewed: 11/26/2012 ExitCare Patient Information 2015 [employer]. This information is not intended to replace advice given to you by your health care provider. Make sure you discuss any questions you have with your health care provider.  

## 2014-09-02 NOTE — Progress Notes (Signed)
[facility] [address] [place] [address] [phone]  Date:  09/02/2014   Name:  Dustin Gardner   DOB:  08/02/1945   MRN:  [id number]  PCP:  No primary care provider on file.    Chief Complaint: Medication Refill   History of Present Illness:  Dustin Gardner is a 69 y.o. very pleasant male patient who presents with the following:  History of BPH and has exhausted his supply of terazosin No increased symptoms of BPH stretching his meds. Denies other complaint or health concern today.   Patient Active Problem List   Diagnosis Date Noted  . CAP (community acquired pneumonia) 09/14/2011    Past Medical History  Diagnosis Date  . BPH (benign prostatic hyperplasia)   . Chronic sinusitis   . Allergy     Past Surgical History  Procedure Laterality Date  . Hernia repair      History  Substance Use Topics  . Smoking status: Never Smoker   . Smokeless tobacco: Not on file  . Alcohol Use: 7.0 oz/week    14 drink(s) per week    Family History  Problem Relation Age of Onset  . Cancer Mother     lung  . Heart disease Father   . Heart disease Brother     Allergies  Allergen Reactions  . Wellbutrin [Bupropion Hcl] Rash    Medication list has been reviewed and updated.  Current Outpatient Prescriptions on File Prior to Visit  Medication Sig Dispense Refill  . amphetamine-dextroamphetamine (ADDERALL) 15 MG tablet Take 15 mg by mouth 3 (three) times daily.     . fluticasone (FLONASE) 50 MCG/ACT nasal spray Place 2 sprays into the nose daily. 16 g 12  . loratadine (CLARITIN) 10 MG tablet Take 10 mg by mouth daily.    . sodium chloride (OCEAN) 0.65 % nasal spray Place 1 spray into the nose as needed. For congestion    . terazosin (HYTRIN) 2 MG capsule Take 1 capsule (2 mg total) by mouth at bedtime. 30 capsule 12  . clonazePAM (KLONOPIN) 1 MG tablet Take 1 mg by mouth at bedtime as needed.    Marland Kitchen guaiFENesin (MUCINEX) 600 MG 12 hr tablet Take 600  mg by mouth 2 (two) times daily.    Marland Kitchen ibuprofen (ADVIL,MOTRIN) 200 MG tablet Take 400 mg by mouth every 6 (six) hours as needed. For pain     No current facility-administered medications on file prior to visit.    Review of Systems:  As per HPI, otherwise negative.    Physical Examination: Filed Vitals:   09/02/14 1136  BP: 120/68  Pulse: 70  Temp: 98 F (36.7 C)  Resp: 16   Filed Vitals:   09/02/14 1136  Height: 5\' 6"  (1.676 m)  Weight: 143 lb (64.864 kg)   Body mass index is 23.09 kg/(m^2). Ideal Body Weight: Weight in (lb) to have BMI = 25: 154.6   GEN: WDWN, NAD, Non-toxic, Alert & Oriented x 3 HEENT: Atraumatic, Normocephalic.  Ears and Nose: No external deformity. EXTR: No clubbing/cyanosis/edema NEURO: Normal gait.  PSYCH: Normally interactive. Conversant. Not depressed or anxious appearing.  Calm demeanor.    Assessment and Plan: BPH Refill  Signed,  Ellison Carwin, MD

## 2015-07-22 DIAGNOSIS — K648 Other hemorrhoids: Secondary | ICD-10-CM | POA: Diagnosis not present

## 2015-07-22 DIAGNOSIS — K641 Second degree hemorrhoids: Secondary | ICD-10-CM | POA: Diagnosis not present

## 2015-07-22 DIAGNOSIS — Z86010 Personal history of colonic polyps: Secondary | ICD-10-CM | POA: Diagnosis not present

## 2015-07-22 DIAGNOSIS — Z09 Encounter for follow-up examination after completed treatment for conditions other than malignant neoplasm: Secondary | ICD-10-CM | POA: Diagnosis not present

## 2015-07-22 DIAGNOSIS — K5730 Diverticulosis of large intestine without perforation or abscess without bleeding: Secondary | ICD-10-CM | POA: Diagnosis not present

## 2015-08-11 DIAGNOSIS — Z2089 Contact with and (suspected) exposure to other communicable diseases: Secondary | ICD-10-CM | POA: Diagnosis not present

## 2015-08-11 DIAGNOSIS — K641 Second degree hemorrhoids: Secondary | ICD-10-CM | POA: Diagnosis not present

## 2015-08-25 DIAGNOSIS — K641 Second degree hemorrhoids: Secondary | ICD-10-CM | POA: Diagnosis not present

## 2015-09-08 DIAGNOSIS — K641 Second degree hemorrhoids: Secondary | ICD-10-CM | POA: Diagnosis not present

## 2015-09-29 DIAGNOSIS — N529 Male erectile dysfunction, unspecified: Secondary | ICD-10-CM | POA: Diagnosis not present

## 2015-09-29 DIAGNOSIS — Z0000 Encounter for general adult medical examination without abnormal findings: Secondary | ICD-10-CM | POA: Diagnosis not present

## 2015-09-29 DIAGNOSIS — Z23 Encounter for immunization: Secondary | ICD-10-CM | POA: Diagnosis not present

## 2015-09-29 DIAGNOSIS — N400 Enlarged prostate without lower urinary tract symptoms: Secondary | ICD-10-CM | POA: Diagnosis not present

## 2015-12-01 DIAGNOSIS — N529 Male erectile dysfunction, unspecified: Secondary | ICD-10-CM | POA: Diagnosis not present

## 2015-12-01 DIAGNOSIS — N400 Enlarged prostate without lower urinary tract symptoms: Secondary | ICD-10-CM | POA: Diagnosis not present

## 2015-12-01 DIAGNOSIS — K219 Gastro-esophageal reflux disease without esophagitis: Secondary | ICD-10-CM | POA: Diagnosis not present

## 2015-12-01 DIAGNOSIS — E782 Mixed hyperlipidemia: Secondary | ICD-10-CM | POA: Diagnosis not present

## 2015-12-01 DIAGNOSIS — Z23 Encounter for immunization: Secondary | ICD-10-CM | POA: Diagnosis not present

## 2016-04-05 DIAGNOSIS — Z23 Encounter for immunization: Secondary | ICD-10-CM | POA: Diagnosis not present

## 2016-06-15 DIAGNOSIS — F341 Dysthymic disorder: Secondary | ICD-10-CM | POA: Diagnosis not present

## 2016-06-15 DIAGNOSIS — F902 Attention-deficit hyperactivity disorder, combined type: Secondary | ICD-10-CM | POA: Diagnosis not present

## 2016-09-25 DIAGNOSIS — Z6823 Body mass index (BMI) 23.0-23.9, adult: Secondary | ICD-10-CM | POA: Diagnosis not present

## 2016-09-25 DIAGNOSIS — Z79899 Other long term (current) drug therapy: Secondary | ICD-10-CM | POA: Diagnosis not present

## 2016-09-25 DIAGNOSIS — J309 Allergic rhinitis, unspecified: Secondary | ICD-10-CM | POA: Diagnosis not present

## 2016-09-25 DIAGNOSIS — N400 Enlarged prostate without lower urinary tract symptoms: Secondary | ICD-10-CM | POA: Diagnosis not present

## 2016-09-25 DIAGNOSIS — Z125 Encounter for screening for malignant neoplasm of prostate: Secondary | ICD-10-CM | POA: Diagnosis not present

## 2016-11-24 ENCOUNTER — Encounter (HOSPITAL_COMMUNITY): Payer: Self-pay | Admitting: *Deleted

## 2016-11-24 ENCOUNTER — Encounter (HOSPITAL_COMMUNITY)
Admission: EM | Disposition: A | Discharge status: Home / Self Care | Payer: Self-pay | Source: Home / Self Care | Attending: Cardiovascular Disease

## 2016-11-24 ENCOUNTER — Inpatient Hospital Stay (HOSPITAL_COMMUNITY)
Admission: EM | Admit: 2016-11-24 | Discharge: 2016-11-26 | DRG: 247 | Disposition: A | Discharge status: Home / Self Care | Payer: PPO | Attending: Cardiovascular Disease | Admitting: Cardiovascular Disease

## 2016-11-24 DIAGNOSIS — E785 Hyperlipidemia, unspecified: Secondary | ICD-10-CM | POA: Diagnosis present

## 2016-11-24 DIAGNOSIS — I252 Old myocardial infarction: Secondary | ICD-10-CM | POA: Diagnosis present

## 2016-11-24 DIAGNOSIS — I255 Ischemic cardiomyopathy: Secondary | ICD-10-CM | POA: Diagnosis not present

## 2016-11-24 DIAGNOSIS — D649 Anemia, unspecified: Secondary | ICD-10-CM | POA: Diagnosis not present

## 2016-11-24 DIAGNOSIS — Z7951 Long term (current) use of inhaled steroids: Secondary | ICD-10-CM

## 2016-11-24 DIAGNOSIS — I2102 ST elevation (STEMI) myocardial infarction involving left anterior descending coronary artery: Secondary | ICD-10-CM | POA: Diagnosis not present

## 2016-11-24 DIAGNOSIS — Z8249 Family history of ischemic heart disease and other diseases of the circulatory system: Secondary | ICD-10-CM

## 2016-11-24 DIAGNOSIS — N400 Enlarged prostate without lower urinary tract symptoms: Secondary | ICD-10-CM | POA: Diagnosis not present

## 2016-11-24 DIAGNOSIS — E876 Hypokalemia: Secondary | ICD-10-CM | POA: Diagnosis present

## 2016-11-24 DIAGNOSIS — Z955 Presence of coronary angioplasty implant and graft: Secondary | ICD-10-CM

## 2016-11-24 DIAGNOSIS — I2510 Atherosclerotic heart disease of native coronary artery without angina pectoris: Secondary | ICD-10-CM

## 2016-11-24 DIAGNOSIS — R531 Weakness: Secondary | ICD-10-CM | POA: Diagnosis not present

## 2016-11-24 DIAGNOSIS — Z79899 Other long term (current) drug therapy: Secondary | ICD-10-CM | POA: Diagnosis not present

## 2016-11-24 DIAGNOSIS — I2109 ST elevation (STEMI) myocardial infarction involving other coronary artery of anterior wall: Principal | ICD-10-CM | POA: Diagnosis present

## 2016-11-24 DIAGNOSIS — I519 Heart disease, unspecified: Secondary | ICD-10-CM | POA: Diagnosis not present

## 2016-11-24 DIAGNOSIS — R079 Chest pain, unspecified: Secondary | ICD-10-CM | POA: Diagnosis not present

## 2016-11-24 HISTORY — DX: Old myocardial infarction: I25.2

## 2016-11-24 HISTORY — PX: LEFT HEART CATH AND CORONARY ANGIOGRAPHY: CATH118249

## 2016-11-24 HISTORY — PX: CORONARY/GRAFT ACUTE MI REVASCULARIZATION: CATH118305

## 2016-11-24 LAB — APTT: aPTT: >200 seconds (ref 24–36)

## 2016-11-24 LAB — CBC
HCT: 37.4 % — ABNORMAL LOW (ref 39.0–52.0)
Hemoglobin: 12.7 g/dL — ABNORMAL LOW (ref 13.0–17.0)
MCH: 31.3 pg (ref 26.0–34.0)
MCHC: 34 g/dL (ref 30.0–36.0)
MCV: 92.1 fL (ref 78.0–100.0)
Platelets: 188 10*3/uL (ref 150–400)
RBC: 4.06 MIL/uL — AB (ref 4.22–5.81)
RDW: 12.8 % (ref 11.5–15.5)
WBC: 5.1 10*3/uL (ref 4.0–10.5)

## 2016-11-24 LAB — POCT I-STAT, CHEM 8
BUN: 11 mg/dL (ref 6–20)
CREATININE: 0.9 mg/dL (ref 0.61–1.24)
Calcium, Ion: 1.2 mmol/L (ref 1.15–1.40)
Chloride: 102 mmol/L (ref 101–111)
GLUCOSE: 113 mg/dL — AB (ref 65–99)
HCT: 37 % — ABNORMAL LOW (ref 39.0–52.0)
Hemoglobin: 12.6 g/dL — ABNORMAL LOW (ref 13.0–17.0)
POTASSIUM: 3.4 mmol/L — AB (ref 3.5–5.1)
Sodium: 139 mmol/L (ref 135–145)
TCO2: 26 mmol/L (ref 0–100)

## 2016-11-24 LAB — COMPREHENSIVE METABOLIC PANEL
ALT: 17 U/L (ref 17–63)
AST: 24 U/L (ref 15–41)
Albumin: 3.8 g/dL (ref 3.5–5.0)
Alkaline Phosphatase: 40 U/L (ref 38–126)
Anion gap: 6 (ref 5–15)
BILIRUBIN TOTAL: 0.7 mg/dL (ref 0.3–1.2)
BUN: 9 mg/dL (ref 6–20)
CHLORIDE: 105 mmol/L (ref 101–111)
CO2: 26 mmol/L (ref 22–32)
Calcium: 8.8 mg/dL — ABNORMAL LOW (ref 8.9–10.3)
Creatinine, Ser: 0.83 mg/dL (ref 0.61–1.24)
Glucose, Bld: 110 mg/dL — ABNORMAL HIGH (ref 65–99)
POTASSIUM: 3.3 mmol/L — AB (ref 3.5–5.1)
Sodium: 137 mmol/L (ref 135–145)
TOTAL PROTEIN: 6.3 g/dL — AB (ref 6.5–8.1)

## 2016-11-24 LAB — TROPONIN I
TROPONIN I: 0.09 ng/mL — AB (ref <0.03)
Troponin I: 1.21 ng/mL (ref <0.03)

## 2016-11-24 LAB — LIPID PANEL
CHOL/HDL RATIO: 3.6 ratio
CHOLESTEROL: 181 mg/dL (ref 0–200)
HDL: 50 mg/dL (ref >40)
LDL Cholesterol: 110 mg/dL — ABNORMAL HIGH (ref 0–99)
TRIGLYCERIDES: 104 mg/dL (ref <150)
VLDL: 21 mg/dL (ref 0–40)

## 2016-11-24 LAB — PROTIME-INR
INR: 1.11
Prothrombin Time: 14.4 seconds (ref 11.4–15.2)

## 2016-11-24 LAB — POCT ACTIVATED CLOTTING TIME: Activated Clotting Time: 268 seconds

## 2016-11-24 LAB — BRAIN NATRIURETIC PEPTIDE: B Natriuretic Peptide: 71 pg/mL (ref 0.0–100.0)

## 2016-11-24 LAB — MRSA PCR SCREENING: MRSA BY PCR: NEGATIVE

## 2016-11-24 SURGERY — LEFT HEART CATH AND CORONARY ANGIOGRAPHY
Anesthesia: LOCAL

## 2016-11-24 MED ORDER — IOPAMIDOL (ISOVUE-370) INJECTION 76%
INTRAVENOUS | Status: DC | PRN
Start: 2016-11-24 — End: 2016-11-24
  Administered 2016-11-24: 155 mL via INTRA_ARTERIAL

## 2016-11-24 MED ORDER — TIROFIBAN HCL IN NACL 5-0.9 MG/100ML-% IV SOLN
0.1500 ug/kg/min | INTRAVENOUS | Status: AC
  Administered 2016-11-24: 0.15 ug/kg/min via INTRAVENOUS
  Filled 2016-11-24 (×2): qty 100

## 2016-11-24 MED ORDER — HEPARIN SODIUM (PORCINE) 1000 UNIT/ML IJ SOLN
INTRAMUSCULAR | Status: DC | PRN
Start: 2016-11-24 — End: 2016-11-24
  Administered 2016-11-24: 2000 [IU] via INTRAVENOUS
  Administered 2016-11-24: 4000 [IU] via INTRAVENOUS

## 2016-11-24 MED ORDER — LIDOCAINE HCL 1 % IJ SOLN
INTRAMUSCULAR | Status: AC
  Filled 2016-11-24: qty 20

## 2016-11-24 MED ORDER — TIROFIBAN HCL IN NACL 5-0.9 MG/100ML-% IV SOLN
INTRAVENOUS | Status: AC
  Filled 2016-11-24: qty 100

## 2016-11-24 MED ORDER — HEPARIN (PORCINE) IN NACL 2-0.9 UNIT/ML-% IJ SOLN
INTRAMUSCULAR | Status: AC | PRN
  Administered 2016-11-24: 1000 mL

## 2016-11-24 MED ORDER — AMPHETAMINE-DEXTROAMPHETAMINE 10 MG PO TABS
15.0000 mg | ORAL_TABLET | Freq: Three times a day (TID) | ORAL | Status: DC
  Administered 2016-11-25: 10 mg via ORAL
  Filled 2016-11-24: qty 2

## 2016-11-24 MED ORDER — CARVEDILOL 3.125 MG PO TABS
3.1250 mg | ORAL_TABLET | Freq: Two times a day (BID) | ORAL | Status: DC
  Administered 2016-11-25 – 2016-11-26 (×2): 3.125 mg via ORAL
  Filled 2016-11-24 (×3): qty 1

## 2016-11-24 MED ORDER — LABETALOL HCL 5 MG/ML IV SOLN: 10.0000 mg | INTRAVENOUS | Status: AC | PRN

## 2016-11-24 MED ORDER — FENTANYL CITRATE (PF) 100 MCG/2ML IJ SOLN
INTRAMUSCULAR | Status: DC | PRN
  Administered 2016-11-24: 25 ug via INTRAVENOUS

## 2016-11-24 MED ORDER — NITROGLYCERIN 1 MG/10 ML FOR IR/CATH LAB
INTRA_ARTERIAL | Status: DC | PRN
Start: 2016-11-24 — End: 2016-11-24
  Administered 2016-11-24: 150 ug via INTRACORONARY

## 2016-11-24 MED ORDER — VERAPAMIL HCL 2.5 MG/ML IV SOLN
INTRAVENOUS | Status: AC
  Filled 2016-11-24: qty 2

## 2016-11-24 MED ORDER — ONDANSETRON HCL 4 MG/2ML IJ SOLN: 4.0000 mg | Freq: Four times a day (QID) | INTRAMUSCULAR | Status: DC | PRN

## 2016-11-24 MED ORDER — TICAGRELOR 90 MG PO TABS
ORAL_TABLET | ORAL | Status: AC
Start: 2016-11-24 — End: 2016-11-24
  Filled 2016-11-24: qty 2

## 2016-11-24 MED ORDER — ATORVASTATIN CALCIUM 80 MG PO TABS
80.0000 mg | ORAL_TABLET | Freq: Every day | ORAL | Status: DC
  Administered 2016-11-25: 80 mg via ORAL
  Filled 2016-11-24: qty 1

## 2016-11-24 MED ORDER — HEPARIN (PORCINE) IN NACL 2-0.9 UNIT/ML-% IJ SOLN
INTRAMUSCULAR | Status: DC | PRN
  Administered 2016-11-24: 10 mL via INTRA_ARTERIAL

## 2016-11-24 MED ORDER — TICAGRELOR 90 MG PO TABS
ORAL_TABLET | ORAL | Status: DC | PRN
  Administered 2016-11-24: 180 mg via ORAL

## 2016-11-24 MED ORDER — ACETAMINOPHEN 325 MG PO TABS: 650.0000 mg | ORAL_TABLET | ORAL | Status: DC | PRN

## 2016-11-24 MED ORDER — IOPAMIDOL (ISOVUE-370) INJECTION 76%
INTRAVENOUS | Status: AC
  Filled 2016-11-24: qty 200

## 2016-11-24 MED ORDER — CLONAZEPAM 0.5 MG PO TABS
1.0000 mg | ORAL_TABLET | Freq: Every evening | ORAL | Status: DC | PRN
  Administered 2016-11-25: 1 mg via ORAL
  Filled 2016-11-24: qty 2

## 2016-11-24 MED ORDER — HYDRALAZINE HCL 20 MG/ML IJ SOLN: 5.0000 mg | INTRAMUSCULAR | Status: AC | PRN

## 2016-11-24 MED ORDER — LIDOCAINE HCL (PF) 1 % IJ SOLN
INTRAMUSCULAR | Status: DC | PRN
  Administered 2016-11-24: 3 mL via INTRADERMAL

## 2016-11-24 MED ORDER — SODIUM CHLORIDE 0.9 % IV SOLN: 250.0000 mL | INTRAVENOUS | Status: DC | PRN

## 2016-11-24 MED ORDER — TICAGRELOR 90 MG PO TABS
90.0000 mg | ORAL_TABLET | Freq: Two times a day (BID) | ORAL | Status: DC
  Administered 2016-11-25 – 2016-11-26 (×3): 90 mg via ORAL
  Filled 2016-11-24 (×3): qty 1

## 2016-11-24 MED ORDER — MIDAZOLAM HCL 2 MG/2ML IJ SOLN
INTRAMUSCULAR | Status: AC
  Filled 2016-11-24: qty 2

## 2016-11-24 MED ORDER — FLUTICASONE PROPIONATE 50 MCG/ACT NA SUSP
2.0000 | Freq: Every day | NASAL | Status: DC
  Administered 2016-11-25 – 2016-11-26 (×2): 2 via NASAL
  Filled 2016-11-24: qty 16

## 2016-11-24 MED ORDER — LORATADINE 10 MG PO TABS
10.0000 mg | ORAL_TABLET | Freq: Every day | ORAL | Status: DC
  Administered 2016-11-25 – 2016-11-26 (×2): 10 mg via ORAL
  Filled 2016-11-24 (×2): qty 1

## 2016-11-24 MED ORDER — NITROGLYCERIN 0.4 MG SL SUBL: 0.4000 mg | SUBLINGUAL_TABLET | SUBLINGUAL | Status: DC | PRN

## 2016-11-24 MED ORDER — HEPARIN (PORCINE) IN NACL 2-0.9 UNIT/ML-% IJ SOLN
INTRAMUSCULAR | Status: AC
  Filled 2016-11-24: qty 1500

## 2016-11-24 MED ORDER — SODIUM CHLORIDE 0.9% FLUSH
3.0000 mL | Freq: Two times a day (BID) | INTRAVENOUS | Status: DC
  Administered 2016-11-25 (×2): 3 mL via INTRAVENOUS

## 2016-11-24 MED ORDER — ASPIRIN 81 MG PO CHEW
81.0000 mg | CHEWABLE_TABLET | Freq: Every day | ORAL | Status: DC
  Administered 2016-11-25 – 2016-11-26 (×2): 81 mg via ORAL
  Filled 2016-11-24 (×2): qty 1

## 2016-11-24 MED ORDER — GUAIFENESIN ER 600 MG PO TB12
600.0000 mg | ORAL_TABLET | Freq: Two times a day (BID) | ORAL | Status: DC
  Administered 2016-11-24 – 2016-11-26 (×4): 600 mg via ORAL
  Filled 2016-11-24 (×4): qty 1

## 2016-11-24 MED ORDER — TERAZOSIN HCL 2 MG PO CAPS
2.0000 mg | ORAL_CAPSULE | Freq: Every day | ORAL | Status: DC
  Administered 2016-11-24 – 2016-11-25 (×2): 2 mg via ORAL
  Filled 2016-11-24 (×3): qty 1

## 2016-11-24 MED ORDER — SODIUM CHLORIDE 0.9% FLUSH: 3.0000 mL | INTRAVENOUS | Status: DC | PRN

## 2016-11-24 MED ORDER — SODIUM CHLORIDE 0.9 % IV SOLN
INTRAVENOUS | Status: AC | PRN
  Administered 2016-11-24: 250 mL via INTRAVENOUS

## 2016-11-24 MED ORDER — POTASSIUM CHLORIDE CRYS ER 20 MEQ PO TBCR
40.0000 meq | EXTENDED_RELEASE_TABLET | Freq: Once | ORAL | Status: AC
  Administered 2016-11-24: 40 meq via ORAL
  Filled 2016-11-24: qty 2

## 2016-11-24 MED ORDER — SODIUM CHLORIDE 0.9 % WEIGHT BASED INFUSION: 1.0000 mL/kg/h | INTRAVENOUS | Status: AC

## 2016-11-24 MED ORDER — TIROFIBAN HCL IN NACL 5-0.9 MG/100ML-% IV SOLN
INTRAVENOUS | Status: AC | PRN
  Administered 2016-11-24: 0.15 ug/kg/min via INTRAVENOUS

## 2016-11-24 MED ORDER — TIROFIBAN (AGGRASTAT) BOLUS VIA INFUSION
INTRAVENOUS | Status: DC | PRN
  Administered 2016-11-24: 1625 ug via INTRAVENOUS

## 2016-11-24 MED ORDER — HEPARIN SODIUM (PORCINE) 1000 UNIT/ML IJ SOLN
INTRAMUSCULAR | Status: AC
  Filled 2016-11-24: qty 1

## 2016-11-24 MED ORDER — NITROGLYCERIN 1 MG/10 ML FOR IR/CATH LAB
INTRA_ARTERIAL | Status: AC
  Filled 2016-11-24: qty 10

## 2016-11-24 MED ORDER — MIDAZOLAM HCL 2 MG/2ML IJ SOLN
INTRAMUSCULAR | Status: DC | PRN
  Administered 2016-11-24: 2 mg via INTRAVENOUS

## 2016-11-24 MED ORDER — FENTANYL CITRATE (PF) 100 MCG/2ML IJ SOLN
INTRAMUSCULAR | Status: AC
  Filled 2016-11-24: qty 2

## 2016-11-24 SURGICAL SUPPLY — 19 items
BALLN EMERGE MR 2.5X12 (BALLOONS) ×2
BALLN ~~LOC~~ EMERGE MR 3.25X15 (BALLOONS) ×2
BALLOON EMERGE MR 2.5X12 (BALLOONS) ×1 IMPLANT
BALLOON ~~LOC~~ EMERGE MR 3.25X15 (BALLOONS) ×1 IMPLANT
CATH INFINITI 5FR ANG PIGTAIL (CATHETERS) ×2 IMPLANT
CATH INFINITI JR4 5F (CATHETERS) ×2 IMPLANT
CATH VISTA GUIDE 6FR XBLAD3.5 (CATHETERS) ×2 IMPLANT
DEVICE RAD COMP TR BAND LRG (VASCULAR PRODUCTS) ×2 IMPLANT
GLIDESHEATH SLEND SS 6F .021 (SHEATH) ×2 IMPLANT
GUIDEWIRE INQWIRE 1.5J.035X260 (WIRE) ×1 IMPLANT
INQWIRE 1.5J .035X260CM (WIRE) ×2
KIT ENCORE 26 ADVANTAGE (KITS) ×2 IMPLANT
KIT HEART LEFT (KITS) ×2 IMPLANT
PACK CARDIAC CATHETERIZATION (CUSTOM PROCEDURE TRAY) ×2 IMPLANT
STENT SYNERGY DES 3X20 (Permanent Stent) ×2 IMPLANT
SYR MEDRAD MARK V 150ML (SYRINGE) ×2 IMPLANT
TRANSDUCER W/STOPCOCK (MISCELLANEOUS) ×2 IMPLANT
TUBING CIL FLEX 10 FLL-RA (TUBING) ×2 IMPLANT
WIRE COUGAR XT STRL 190CM (WIRE) ×2 IMPLANT

## 2016-11-24 NOTE — Progress Notes (Signed)
Stopped back by to see pt, now settled in rm. His girlfriend [place] had met up w/ him and gone home. He said she would have been anxious about him b/c of two prior traumatic deaths she experienced, and asked that be borne in mind when she seems anxious about him: her husband died at 32, when they had 2 little girls; and one of those daughters died also in her early 33's. Provided emotional support. He was appreciative of visit. Chaplain available for f/u.   11/24/16 1800  Clinical Encounter Type  Visited With Patient  Visit Type Follow-up;Critical Care  Referral From Cook Directives  Does Patient Have a Mental Health Advance Directive? No   Gerrit Heck, Chaplain

## 2016-11-24 NOTE — Progress Notes (Signed)
Met pt's long-time significant other Donnal Moat and escorted her to [facility] waiting rm. She is anxious about pt's state of health, as it was she who called EMS -- then felt they took a long time to respond. Went to Cath Lab to check on pt, and gave them her name/location. They will page chaplain and/or find her in waiting rm to give pt update when it's available.   Returned to reassure her, providing emotional/spiritual support, ministry of presence, and prayer. She felt latter "couldn't hurt" - saying she'd grown up in church and, although a believer, was spiritual but not religious. She said pt didn't have anything to do w/ organized religion. Chaplain available for f/u.   11/24/16 1700  Clinical Encounter Type  Visited With Family;Health care provider  Visit Type Initial;Psychological support;Spiritual support;Social support  Referral From Nurse  Spiritual Encounters  Spiritual Needs Prayer;Emotional  Stress Factors  Patient Stress Factors Health changes;Loss of control  Family Stress Factors Family relationships;Health changes;Loss of control   Gerrit Heck, Chaplain

## 2016-11-24 NOTE — Progress Notes (Signed)
CRITICAL VALUE ALERT  Critical value received:  Trop 1.21  Date of notification:  5/18  Time of notification:  2045   Critical value read back:Yes.    Nurse who received alert:  Jaynie Bream  MD notified (1st page):  Dr Teola Bradley  Time: 2200

## 2016-11-24 NOTE — H&P (Signed)
Patient ID: GURINDER TORAL MRN: [id number], DOB/AGE: Nov 13, 1945   Admit date: 11/24/2016   Primary Physician: Patient, No Pcp Per Primary Cardiologist: New - Dr. Burt Knack Reason for admission: inferior STEMI  HPI:  Dustin Gardner is a 71 y.o. male with a history of allergies and BPH and no past cardiac history who presented to [facility] via EMS on 11/24/16 with anterior STEMI.   He has no past cardiac history but does have a strong family history of CAD. Both his brothers and fathers have had stents placed in their 16s. He is a never smoker and drinks 2 beers a day. He follows regularly with a medical provider and has never been diagnosed with HTN, HLD, DMT2, CVA/TIA, or bleeding problems. He has had no recent surgeries.   He was in his usual state of health until the past 1-2 weeks when he started noticing intermittent chest pain that he attributed to GERD. Once time it was worse with exertion. Today he was sitting at rest when he had sudden onset of severe 10/10 chest pain that radiated to his left arm associated with diaphoresis. He called EMS and was found to have acute anterior STEMI and was taken to [facility] cath lab.    Problem List  Past Medical History:  Diagnosis Date  . Allergy   . BPH (benign prostatic hyperplasia)   . Chronic sinusitis     Past Surgical History:  Procedure Laterality Date  . HERNIA REPAIR       Allergies  Allergies  Allergen Reactions  . Wellbutrin [Bupropion Hcl] Rash     Home Medications  Prior to Admission medications   Medication Sig Start Date End Date Taking? Authorizing Provider  amphetamine-dextroamphetamine (ADDERALL) 15 MG tablet Take 15 mg by mouth 3 (three) times daily.     [provider]  clonazePAM (KLONOPIN) 1 MG tablet Take 1 mg by mouth at bedtime as needed.    [provider]  fluticasone (FLONASE) 50 MCG/ACT nasal spray Place 2 sprays into the nose daily. 10/09/12   Posey Boyer, MD  guaiFENesin (MUCINEX) 600 MG  12 hr tablet Take 600 mg by mouth 2 (two) times daily.    [provider]  ibuprofen (ADVIL,MOTRIN) 200 MG tablet Take 400 mg by mouth every 6 (six) hours as needed. For pain    [provider]  loratadine (CLARITIN) 10 MG tablet Take 10 mg by mouth daily.    [provider]  sodium chloride (OCEAN) 0.65 % nasal spray Place 1 spray into the nose as needed. For congestion    [provider]  terazosin (HYTRIN) 2 MG capsule Take 1 capsule (2 mg total) by mouth at bedtime. 09/02/14   Roselee Culver, MD    Family History  Family History  Problem Relation Age of Onset  . Cancer Mother        lung  . Heart disease Father   . Heart disease Brother    Family Status  Relation Status  . Mother Deceased  . Father Deceased  . Sister Alive  . Brother Alive  . MGM Deceased  . MGF Deceased  . PGM Deceased  . PGF Deceased  . Brother Alive   Social History  Social History   Social History  . Marital status: Single    Spouse name: N/A  . Number of children: N/A  . Years of education: N/A   Occupational History  . Not on file.   Social  History Main Topics  . Smoking status: Never Smoker  . Smokeless tobacco: Not on file  . Alcohol use 7.0 oz/week    14 drink(s) per week  . Drug use: No  . Sexual activity: Yes   Other Topics Concern  . Not on file   Social History Narrative   Works as a Engineer, water.      Review of Systems General:  No chills, fever, night sweats or weight changes.  Cardiovascular:  +++ chest pain, +++ dyspnea on exertion, no LEedema, orthopnea, palpitations, paroxysmal nocturnal dyspnea. Dermatological: No rash, lesions/masses Respiratory: No cough, dyspnea Urologic: No hematuria, dysuria Abdominal:   No nausea, vomiting, diarrhea, bright red blood per rectum, melena, or hematemesis Neurologic:  No visual changes, wkns, changes in mental status. All other systems reviewed and are otherwise negative except as noted  above.  Physical Exam  SpO2 95 %.  General: Pleasant, NAD Psych: Normal affect. Neuro: Alert and oriented X 3. Moves all extremities spontaneously. HEENT: Normal  Neck: Supple without bruits or JVD. Lungs:  Resp regular and unlabored, CTA. Heart: RRR no s3, s4, or murmurs. Abdomen: Soft, non-tender, non-distended, BS + x 4.  Extremities: No clubbing, cyanosis or edema. DP/PT/Radials 2+ and equal bilaterally.  Labs  No results for input(s): CKTOTAL, CKMB, TROPONINI in the last 72 hours. Lab Results  Component Value Date   WBC 4.4 (A) 10/09/2012   HGB 13.4 (A) 10/09/2012   HCT 42.0 (A) 10/09/2012   MCV 95.1 10/09/2012   PLT 395 09/15/2011   No results for input(s): NA, K, CL, CO2, BUN, CREATININE, CALCIUM, PROT, BILITOT, ALKPHOS, ALT, AST, GLUCOSE in the last 168 hours.  Invalid input(s): LABALBU Lab Results  Component Value Date   CHOL 183 10/09/2012   HDL 56 10/09/2012   LDLCALC 115 (H) 10/09/2012   TRIG 60 10/09/2012   No results found for: DDIMER   Radiology/Studies  No results found.  ECG  Sinus HR 77 with STE in V1-V5  ASSESSMENT AND PLAN  Dustin Gardner is a 71 y.o. male with a history of BPH and no past cardiac history who presented to [facility] via EMS on 11/24/16 with anterior STEMI.   PLAN: emergent cardiac cath with possible PCI. Will obtain basic blood work including CBC, BMET, lipid panel, TSH and HgA1c.    SignedAngelena Form, PA-C 11/24/2016, 5:03 PM  Pager [phone]  Patient seen, examined. Available data reviewed. Agree with findings, assessment, and plan as outlined by Angelena Form, PA-C. The patient is independently interviewed and examined. He is brought directly to the cardiac catheterization lab by EMS as a code STEMI. I personally took the patient's history along with Angelena Form and agree with documentation as outlined. On my exam, the patient is alert and oriented in mild distress because of chest pain. HEENT is normal, neck with  normal JVP, normal carotid upstrokes without bruits, lungs are clear, heart is regular rate and rhythm with no murmur or gallop, abdomen is soft and nontender with no masses, extremities are without edema, skin is warm and dry but the patient is somewhat pale. Neurologic is grossly intact with normal strength bilaterally.  The patient presents with an acute anterior STEMI in the context of a chronic right bundle branch block. His symptoms of been ongoing for 1 hour and emergency cardiac catheterization and PCI are indicated. Risks and indications are discussed with patient. Emergency implied consent is obtained. The patient is given heparin 4000 units on arrival to the cardiac catheterization  lab. He has received aspirin 324 mg. Further plans pending his cardiac catheterization result.  Sherren Mocha, M.D. 11/24/2016 5:43 PM

## 2016-11-25 ENCOUNTER — Encounter (HOSPITAL_COMMUNITY): Payer: Self-pay | Admitting: *Deleted

## 2016-11-25 ENCOUNTER — Inpatient Hospital Stay (HOSPITAL_COMMUNITY): Payer: PPO

## 2016-11-25 DIAGNOSIS — I2510 Atherosclerotic heart disease of native coronary artery without angina pectoris: Secondary | ICD-10-CM

## 2016-11-25 LAB — CBC
HCT: 38.7 % — ABNORMAL LOW (ref 39.0–52.0)
Hemoglobin: 13 g/dL (ref 13.0–17.0)
MCH: 31.4 pg (ref 26.0–34.0)
MCHC: 33.6 g/dL (ref 30.0–36.0)
MCV: 93.5 fL (ref 78.0–100.0)
Platelets: 171 K/uL (ref 150–400)
RBC: 4.14 MIL/uL — ABNORMAL LOW (ref 4.22–5.81)
RDW: 13.1 % (ref 11.5–15.5)
WBC: 5.9 K/uL (ref 4.0–10.5)

## 2016-11-25 LAB — ECHOCARDIOGRAM COMPLETE
Height: 67 in
Weight: 2285.73 [oz_av]

## 2016-11-25 LAB — LIPID PANEL
CHOLESTEROL: 168 mg/dL (ref 0–200)
HDL: 48 mg/dL (ref >40)
LDL Cholesterol: 102 mg/dL — ABNORMAL HIGH (ref 0–99)
Total CHOL/HDL Ratio: 3.5 RATIO
Triglycerides: 91 mg/dL (ref <150)
VLDL: 18 mg/dL (ref 0–40)

## 2016-11-25 LAB — BASIC METABOLIC PANEL WITH GFR
Anion gap: 5 (ref 5–15)
BUN: 7 mg/dL (ref 6–20)
CO2: 22 mmol/L (ref 22–32)
Calcium: 8.6 mg/dL — ABNORMAL LOW (ref 8.9–10.3)
Chloride: 112 mmol/L — ABNORMAL HIGH (ref 101–111)
Creatinine, Ser: 0.76 mg/dL (ref 0.61–1.24)
GFR calc Af Amer: >60 mL/min
GFR calc non Af Amer: >60 mL/min
Glucose, Bld: 94 mg/dL (ref 65–99)
Potassium: 3.8 mmol/L (ref 3.5–5.1)
Sodium: 139 mmol/L (ref 135–145)

## 2016-11-25 LAB — HEMOGLOBIN A1C
HEMOGLOBIN A1C: 5.1 % (ref 4.8–5.6)
Hgb A1c MFr Bld: 5.1 % (ref 4.8–5.6)
MEAN PLASMA GLUCOSE: 100 mg/dL
Mean Plasma Glucose: 100 mg/dL

## 2016-11-25 LAB — TROPONIN I
Troponin I: 2.81 ng/mL (ref <0.03)
Troponin I: 1.39 ng/mL (ref <0.03)

## 2016-11-25 LAB — GLUCOSE, CAPILLARY
Glucose-Capillary: 90 mg/dL (ref 65–99)
Glucose-Capillary: 90 mg/dL (ref 65–99)
Glucose-Capillary: 93 mg/dL (ref 65–99)

## 2016-11-25 MED ORDER — AMPHETAMINE-DEXTROAMPHETAMINE 10 MG PO TABS
10.0000 mg | ORAL_TABLET | Freq: Three times a day (TID) | ORAL | Status: DC
  Administered 2016-11-25: 5 mg via ORAL
  Filled 2016-11-25: qty 1

## 2016-11-25 MED ORDER — SODIUM CHLORIDE 0.9 % IV BOLUS (SEPSIS)
250.0000 mL | Freq: Once | INTRAVENOUS | Status: AC
  Administered 2016-11-25: 250 mL via INTRAVENOUS

## 2016-11-25 MED ORDER — HEPARIN SODIUM (PORCINE) 5000 UNIT/ML IJ SOLN
5000.0000 [IU] | Freq: Three times a day (TID) | INTRAMUSCULAR | Status: DC
  Administered 2016-11-25 – 2016-11-26 (×3): 5000 [IU] via SUBCUTANEOUS
  Filled 2016-11-25 (×3): qty 1

## 2016-11-25 MED ORDER — AMPHETAMINE-DEXTROAMPHETAMINE 10 MG PO TABS
5.0000 mg | ORAL_TABLET | Freq: Three times a day (TID) | ORAL | Status: DC
  Administered 2016-11-25 – 2016-11-26 (×3): 5 mg via ORAL
  Filled 2016-11-25 (×3): qty 1

## 2016-11-25 NOTE — Progress Notes (Signed)
  Echocardiogram 2D Echocardiogram has been performed.  Dustin Gardner 11/25/2016, 4:00 PM

## 2016-11-25 NOTE — Progress Notes (Signed)
CARDIAC REHAB PHASE I   PRE:  Rate/Rhythm: SR 75  BP:  Supine: 108/63  Sitting: 98/55  Standing: 82/64   SaO2: 95 RA  MODE:  Ambulation: 160 ft   POST:  Rate/Rhythm:   BP:  Supine:   Sitting:   Standing: 88/64   SaO2: 96  Pt up to ambulate x 1 assist in hallway 160 feet. Orthostatic bp checked as noted above. Pt with decrease in bp while standing but clinical has no complaints of any dizziness or feeling light headed. Pt reports that previously through the night he felt "spacey".  Pt tolerated well with no complaints of cp or sob.  Post ambulation bp checked, nurse in room for transfer to telemetry.  Brief education completed with pt and significant other.  Pt has good understanding of heart healthy eating, general exercise.  Pt okay for contact information to be sent to outpatient cardiac rehab phase II program here in [place]. Pt to wheelchair for transfer. Maurice Small RN, BSN Cardiac and Training and development officer [phone]

## 2016-11-25 NOTE — Plan of Care (Signed)
Problem: Cardiovascular: Goal: Ability to achieve and maintain adequate cardiovascular perfusion will improve Outcome: Progressing Hemodynamics maintained without cardiac gtts Goal: Vascular access site(s) Level 0-1 will be maintained Outcome: Progressing TR band removed at 0200 5/19 site level 0

## 2016-11-25 NOTE — Progress Notes (Signed)
Progress Note  Patient Name: Dustin Gardner Date of Encounter: 11/25/2016  Primary Cardiologist: Dr Burt Knack  Subjective   No chest pain or dyspnea  Inpatient Medications    Scheduled Meds: . amphetamine-dextroamphetamine  10 mg Oral TID WC  . aspirin  81 mg Oral Daily  . atorvastatin  80 mg Oral q1800  . carvedilol  3.125 mg Oral BID WC  . fluticasone  2 spray Each Nare Daily  . guaiFENesin  600 mg Oral BID  . loratadine  10 mg Oral Daily  . sodium chloride flush  3 mL Intravenous Q12H  . terazosin  2 mg Oral QHS  . ticagrelor  90 mg Oral BID   Continuous Infusions: . sodium chloride Stopped (11/25/16 0430)   PRN Meds: sodium chloride, acetaminophen, clonazePAM, nitroGLYCERIN, ondansetron (ZOFRAN) IV, sodium chloride flush   Vital Signs    Vitals:   11/25/16 0600 11/25/16 0700 11/25/16 0800 11/25/16 0847  BP: (!) 85/60 94/67 94/62    Pulse:      Resp: 12 12 15    Temp:    98.2 F (36.8 C)  TempSrc:    Oral  SpO2: 97% 97% 97%   Weight: 64.8 kg (142 lb 13.7 oz)     Height:        Intake/Output Summary (Last 24 hours) at 11/25/16 0900 Last data filed at 11/25/16 0800  Gross per 24 hour  Intake          1125.33 ml  Output             1300 ml  Net          -174.67 ml   Filed Weights   11/24/16 1800 11/25/16 0600  Weight: 64.7 kg (142 lb 10.2 oz) 64.8 kg (142 lb 13.7 oz)    Telemetry    Sinus - Personally Reviewed   Physical Exam   GEN: No acute distress.   Neck: No JVD Cardiac: RRR, no murmurs, rubs, or gallops.  Respiratory: Clear to auscultation bilaterally. GI: Soft, nontender, non-distended  MS: No edema; Radial cath site with no hematoma; ecchymosis noted Neuro:  Nonfocal  Psych: Normal affect   Labs    Chemistry Recent Labs Lab 11/24/16 1706 11/24/16 1710  NA 139 137  K 3.4* 3.3*  CL 102 105  CO2  --  26  GLUCOSE 113* 110*  BUN 11 9  CREATININE 0.90 0.83  CALCIUM  --  8.8*  PROT  --  6.3*  ALBUMIN  --  3.8  AST  --  24  ALT   --  17  ALKPHOS  --  40  BILITOT  --  0.7  GFRNONAA  --  >60  GFRAA  --  >60  ANIONGAP  --  6     Hematology Recent Labs Lab 11/24/16 1706 11/24/16 1710  WBC  --  5.1  RBC  --  4.06*  HGB 12.6* 12.7*  HCT 37.0* 37.4*  MCV  --  92.1  MCH  --  31.3  MCHC  --  34.0  RDW  --  12.8  PLT  --  188    Cardiac Enzymes Recent Labs Lab 11/24/16 1710 11/24/16 1938 11/25/16 0226  TROPONINI 0.09* 1.21* 2.81*    BNP Recent Labs Lab 11/24/16 1938  BNP 71.0     Patient Profile     71 y.o. male admitted with acute anterior myocardial infarction. Cardiac catheterization revealed occluded LAD and he is now status post PCI. Ejection fraction  35%.  Assessment & Plan    1 status post anterior myocardial infarction-patient is doing well status post PCI of LAD. Continue aspirin, brilinta and statin. His ejection fraction is 35% by cath. We will continue low-dose carvedilol. I would like to advance this medication and add ACE inhibitor but his blood pressure will not allow. He is also describing some dizziness with sitting up and standing. We will titrate medications as tolerated. He will need follow-up echocardiogram in 3 months to see if his LV function has improved following revascularization.  2 hyperlipidemia-continue statin.  3 ischemic cardiomyopathy-plan as outlined under #1.  4 normocytic anemia-this will need follow-up as an outpatient.  5 hypokalemia-recheck tomorrow morning.  Transfer to telemetry. Begin ambulation.  Signed, Kirk Ruths, MD  11/25/2016, 9:00 AM

## 2016-11-26 DIAGNOSIS — I519 Heart disease, unspecified: Secondary | ICD-10-CM

## 2016-11-26 LAB — CBC
HCT: 38.8 % — ABNORMAL LOW (ref 39.0–52.0)
Hemoglobin: 13 g/dL (ref 13.0–17.0)
MCH: 31 pg (ref 26.0–34.0)
MCHC: 33.5 g/dL (ref 30.0–36.0)
MCV: 92.6 fL (ref 78.0–100.0)
PLATELETS: 178 10*3/uL (ref 150–400)
RBC: 4.19 MIL/uL — AB (ref 4.22–5.81)
RDW: 13.2 % (ref 11.5–15.5)
WBC: 6.4 10*3/uL (ref 4.0–10.5)

## 2016-11-26 LAB — BASIC METABOLIC PANEL
Anion gap: 7 (ref 5–15)
BUN: 11 mg/dL (ref 6–20)
CALCIUM: 8.7 mg/dL — AB (ref 8.9–10.3)
CO2: 23 mmol/L (ref 22–32)
CREATININE: 0.98 mg/dL (ref 0.61–1.24)
Chloride: 107 mmol/L (ref 101–111)
GFR calc Af Amer: >60 mL/min (ref >60)
GFR calc non Af Amer: >60 mL/min (ref >60)
Glucose, Bld: 103 mg/dL — ABNORMAL HIGH (ref 65–99)
Potassium: 3.6 mmol/L (ref 3.5–5.1)
SODIUM: 137 mmol/L (ref 135–145)

## 2016-11-26 MED ORDER — IRBESARTAN 75 MG PO TABS: 75.0000 mg | ORAL_TABLET | Freq: Every day | ORAL | Status: DC

## 2016-11-26 MED ORDER — CARVEDILOL 3.125 MG PO TABS: 3.1250 mg | ORAL_TABLET | Freq: Two times a day (BID) | ORAL | 6 refills | Status: DC

## 2016-11-26 MED ORDER — NITROGLYCERIN 0.4 MG SL SUBL: 0.4000 mg | SUBLINGUAL_TABLET | SUBLINGUAL | 12 refills | Status: AC | PRN

## 2016-11-26 MED ORDER — TICAGRELOR 90 MG PO TABS: 90.0000 mg | ORAL_TABLET | Freq: Two times a day (BID) | ORAL | 3 refills | Status: DC

## 2016-11-26 MED ORDER — ASPIRIN 81 MG PO CHEW: 81.0000 mg | CHEWABLE_TABLET | Freq: Every day | ORAL | 3 refills | Status: AC

## 2016-11-26 MED ORDER — ATORVASTATIN CALCIUM 80 MG PO TABS: 80.0000 mg | ORAL_TABLET | Freq: Every day | ORAL | 3 refills | Status: DC

## 2016-11-26 MED ORDER — IRBESARTAN 75 MG PO TABS: 75.0000 mg | ORAL_TABLET | Freq: Every day | ORAL | 6 refills | Status: DC

## 2016-11-26 NOTE — Progress Notes (Signed)
Progress Note  Patient Name: Dustin Gardner Date of Encounter: 11/26/2016  Primary Cardiologist: Burt Knack  Subjective   Denies chest pain and dyspnea. Complains of dizziness, which is a long-standing complaint of procedures coronary problems  Inpatient Medications    Scheduled Meds: . amphetamine-dextroamphetamine  5 mg Oral TID WC  . aspirin  81 mg Oral Daily  . atorvastatin  80 mg Oral q1800  . carvedilol  3.125 mg Oral BID WC  . fluticasone  2 spray Each Nare Daily  . guaiFENesin  600 mg Oral BID  . heparin subcutaneous  5,000 Units Subcutaneous Q8H  . loratadine  10 mg Oral Daily  . sodium chloride flush  3 mL Intravenous Q12H  . terazosin  2 mg Oral QHS  . ticagrelor  90 mg Oral BID   Continuous Infusions: . sodium chloride Stopped (11/25/16 0430)   PRN Meds: sodium chloride, acetaminophen, clonazePAM, nitroGLYCERIN, ondansetron (ZOFRAN) IV, sodium chloride flush   Vital Signs    Vitals:   11/25/16 1644 11/25/16 2140 11/26/16 0420 11/26/16 0919  BP: 131/76 117/75 (!) 89/62 96/64  Pulse: 74 65 62 78  Resp:  19 12   Temp:  98.3 F (36.8 C) 98.1 F (36.7 C)   TempSrc:      SpO2:  96% 97%   Weight:   141 lb (64 kg)   Height:        Intake/Output Summary (Last 24 hours) at 11/26/16 1048 Last data filed at 11/26/16 0425  Gross per 24 hour  Intake            762.5 ml  Output             1075 ml  Net           -312.5 ml   Filed Weights   11/24/16 1800 11/25/16 0600 11/26/16 0420  Weight: 142 lb 10.2 oz (64.7 kg) 142 lb 13.7 oz (64.8 kg) 141 lb (64 kg)    Telemetry    Normal sinus rhythm very rare PVCs - Personally Reviewed  ECG    Normal sinus rhythm, right bundle branch block, extensive anterolateral ST depression and T-wave inversion, more prominent than yesterday - Personally Reviewed  Physical Exam  Alert, calm and comfortable, smiling GEN: No acute distress.   Neck: No JVD Cardiac: RRR, no murmurs, rubs, or gallops. Healthy cardiac cath exit  site without hematoma or ecchymosis. Respiratory: Clear to auscultation bilaterally. GI: Soft, nontender, non-distended  MS: No edema; No deformity. Neuro:  Nonfocal  Psych: Normal affect   Labs    Chemistry Recent Labs Lab 11/24/16 1710 11/25/16 0911 11/26/16 0246  NA 137 139 137  K 3.3* 3.8 3.6  CL 105 112* 107  CO2 26 22 23   GLUCOSE 110* 94 103*  BUN 9 7 11   CREATININE 0.83 0.76 0.98  CALCIUM 8.8* 8.6* 8.7*  PROT 6.3*  --   --   ALBUMIN 3.8  --   --   AST 24  --   --   ALT 17  --   --   ALKPHOS 40  --   --   BILITOT 0.7  --   --   GFRNONAA >60 >60 >60  GFRAA >60 >60 >60  ANIONGAP 6 5 7      Hematology Recent Labs Lab 11/24/16 1710 11/25/16 0911 11/26/16 0246  WBC 5.1 5.9 6.4  RBC 4.06* 4.14* 4.19*  HGB 12.7* 13.0 13.0  HCT 37.4* 38.7* 38.8*  MCV 92.1 93.5 92.6  MCH 31.3 31.4 31.0  MCHC 34.0 33.6 33.5  RDW 12.8 13.1 13.2  PLT 188 171 178    Cardiac Enzymes Recent Labs Lab 11/24/16 1710 11/24/16 1938 11/25/16 0226 11/25/16 0911  TROPONINI 0.09* 1.21* 2.81* 1.39*   No results for input(s): TROPIPOC in the last 168 hours.   BNP Recent Labs Lab 11/24/16 1938  BNP 71.0     DDimer No results for input(s): DDIMER in the last 168 hours.   Radiology    No results found.  Cardiac Studies   Echo 11/25/16 Left ventricle: The cavity size was normal. Wall thickness was   normal. Systolic function was mildly reduced. The estimated   ejection fraction was in the range of 45% to 50%. There is   hypokinesis of the anteroseptal myocardium. Doppler parameters   are consistent with abnormal left ventricular relaxation (grade 1   diastolic dysfunction). - Aortic valve: There was mild regurgitation directed towards the   mitral anterior leaflet. - Aorta: Aortic root dimension: 38 mm (ED). - Ascending aorta: The ascending aorta was mildly dilated.  Patient Profile     71 y.o. male admitted with acute anterior myocardial infarction, treated with  drug-eluting stent to proximal LAD which was subtotally occluded. Ejection fraction 35% at cath, 45-50% by echo performed next day. Cardiac enzyme increase was very mild, peak troponin 2.8  Assessment & Plan    1. STEMI anterior wall - Mild elevation in cardiac enzymes with substantial wall motion abnormality suggests extensive stunned myocardium, all ready showing signs of recovery by echo. Plan beta blocker and ARB therapy, expect further recovery. Blood pressure was low when sleeping, but is higher today and will allow adding a very low dose of ARB. The dizziness is not related to his blood pressure and it predates this admission. He would like to see if he can get vestibular PT and will try to organize this before discharge if possible.Marland Kitchen He does not have orthostatic hypotension on exam. He does not have angina pectoris. Reinforced need for dual antiplatelet therapy. On high-dose statin. 2. LV systolic dysfunction - Clinically without signs of heart failure. Discussed warning signs for heart failure decompensation with the patient.   Plan to discharge later today with early clinical follow-up (TOC 1-2 weeks), referred to cardiac rehabilitation. Repeat echo in several weeks.  Signed, Sanda Klein, MD  11/26/2016, 10:48 AM

## 2016-11-26 NOTE — Progress Notes (Addendum)
Physical Therapy Progress Note 11/26/16  13:33  Patient was evaluated by PT.  He is independent with mobility and gait.  However patient with dizziness that he reports as spinning, and is intermittent in nature.  Patient tested positive [facility]) with Lt posterior canal canalithiasis.  Treated using Epley canalith repositioning maneuver.  Patient also reports that these episodes of vertigo happen several times/year.  He has tinnitus and gross decrease in hearing.  *Recommend f/u OP PT for Vestibular Rehab to further evaluate and treat.   MD:  Please order if you agree.  Thank you! Full note to follow.  Carita Pian Sanjuana Kava, [place] Pager [phone]

## 2016-11-26 NOTE — Evaluation (Signed)
Physical Therapy Evaluation Patient Details Name: Dustin Gardner MRN: [id number] DOB: 05-27-1946 Today's Date: 11/26/2016   History of Present Illness  Patient is a 71 yo male admitted 11/24/16 with sudden onset of severe 10/10 chest pain that radiated to his left arm associated with diaphoresis. He called EMS and was found to have acute anterior STEMI and was taken to [facility] cath lab, now s/p PCI.   Patient also c/o dizziness, which has been chronic in nature.    PMH:  chronic sinusitis, BPH, chronic dizziness.  Clinical Impression  Patient is independent with all mobility and gait.  Initiated Vestibular Evaluation (see Vestibular Rehab Data in prior PT note).  Patient found to have Lt posterior canal canalithiasis.  Treated using Epley canalith repositioning maneuver.  Patient also with additional symptoms of tinnitus, hearing loss.  Patient reports his dizziness occurs several times each year for 1-2 days.    Recommend f/u with OP PT for Vestibular Rehab for continued evaluation and treatment of his chronic dizziness. Will return later this pm to evaluate Rt posterior canal and for further education.    Follow Up Recommendations Outpatient PT;Supervision - Intermittent (OP PT for Vestibular Rehab)    Equipment Recommendations  None recommended by PT    Recommendations for Other Services       Precautions / Restrictions Precautions Precautions: None Restrictions Weight Bearing Restrictions: No      Mobility  Bed Mobility Overal bed mobility: Independent                Transfers Overall transfer level: Independent Equipment used: None                Ambulation/Gait Ambulation/Gait assistance: Independent Ambulation Distance (Feet): 100 Feet Assistive device: None Gait Pattern/deviations: WFL(Within Functional Limits) Gait velocity: WFL Gait velocity interpretation: at or above normal speed for age/gender General Gait Details: Patient with good gait pattern, balance,  and speed.  Dizziness was at 2/10 with gait early, not impacting balance.  Patient reports when he is having severe dizziness, he is careful and moves more slowly.  Stairs            Wheelchair Mobility    Modified Rankin (Stroke Patients Only)       Balance Overall balance assessment: Independent         Standing balance support: No upper extremity supported Standing balance-Leahy Scale: Normal (Able to tolerate moderate challenges in all directions )           Rhomberg - Eyes Opened: 30 Rhomberg - Eyes Closed: 30 High level balance activites: Backward walking;Direction changes;Turns;Sudden stops;Head turns High Level Balance Comments: No loss of balance with high level activities             Pertinent Vitals/Pain Pain Assessment: No/denies pain    Home Living Family/patient expects to be discharged to:: Private residence Living Arrangements: Alone (With significant other most of the time) Available Help at Discharge: Family;Friend(s);Available 24 hours/day Type of Home: House Home Access: Stairs to enter Entrance Stairs-Rails: None Entrance Stairs-Number of Steps: 2 Home Layout: One level Home Equipment: None      Prior Function Level of Independence: Independent         Comments: Retired. Active.  Drives.     Hand Dominance   Dominant Hand: Right    Extremity/Trunk Assessment   Upper Extremity Assessment Upper Extremity Assessment: Overall WFL for tasks assessed    Lower Extremity Assessment Lower Extremity Assessment: Overall WFL for tasks assessed  Cervical / Trunk Assessment Cervical / Trunk Assessment: Normal (History of slight neck injury/pain)  Communication   Communication: No difficulties (Slight decrease in hearing bilaterally)  Cognition Arousal/Alertness: Awake/alert Behavior During Therapy: WFL for tasks assessed/performed;Restless Overall Cognitive Status: Within Functional Limits for tasks assessed                                         General Comments      Exercises     Assessment/Plan    PT Assessment Patient needs continued PT services  PT Problem List Decreased knowledge of precautions;Cardiopulmonary status limiting activity (Dizziness)       PT Treatment Interventions Functional mobility training;Therapeutic activities;Therapeutic exercise;Patient/family education (Vestibular Rehab)    PT Goals (Current goals can be found in the Care Plan section)  Acute Rehab PT Goals Patient Stated Goal: To decrease dizziness PT Goal Formulation: With patient Time For Goal Achievement: 12/03/16 Potential to Achieve Goals: Good    Frequency Min 4X/week   Barriers to discharge        Co-evaluation               AM-PAC PT "6 Clicks" Daily Activity  Outcome Measure Difficulty turning over in bed (including adjusting bedclothes, sheets and blankets)?: None Difficulty moving from lying on back to sitting on the side of the bed? : None Difficulty sitting down on and standing up from a chair with arms (e.g., wheelchair, bedside commode, etc,.)?: None Help needed moving to and from a bed to chair (including a wheelchair)?: None Help needed walking in hospital room?: None Help needed climbing 3-5 steps with a railing? : None 6 Click Score: 24    End of Session   Activity Tolerance: Patient tolerated treatment well Patient left: in chair;with call bell/phone within reach;with family/visitor present Nurse Communication: Mobility status (Lt posterior canal BPPV.  Needs OP PT for Vestibular Rehab) PT Visit Diagnosis: BPPV;Dizziness and giddiness (R42) BPPV - Right/Left : Left    Time: 0962-8366 PT Time Calculation (min) (ACUTE ONLY): 41 min   Charges:   PT Evaluation $PT Eval Moderate Complexity: 1 Procedure PT Treatments $Therapeutic Activity: 8-22 mins $Canalith Rep Proc: 8-22 mins   PT G Codes:        Carita Pian. Sanjuana Kava, [facility] Acute Rehab Services Pager  [phone]   Despina Pole 11/26/2016, 5:06 PM

## 2016-11-26 NOTE — Progress Notes (Signed)
Physical Therapy Treatment Patient Details Name: Dustin Gardner MRN: [id number] DOB: [birth year] Today's Date: 11/26/2016    History of Present Illness Patient is a 71 yo male admitted 11/24/16 with sudden onset of severe 10/10 chest pain that radiated to his left arm associated with diaphoresis. He called EMS and was found to have acute anterior STEMI and was taken to [facility] cath lab, now s/p PCI.   Patient also c/o dizziness, which has been chronic in nature.    PMH:  chronic sinusitis, BPH, chronic dizziness.    PT Comments    Performed Rt and then Lt Dix-Hallpike with negative results.  Lt BPPV improved following treatment earlier today.  Results very evident by patient.  Patient instructed on use of target to minimize dizziness at rest and with movement.  Encouraged patient to f/u with OP PT Vestibular Rehab due to chronic nature of his dizziness. Patient to be d/c today.    Follow Up Recommendations  Outpatient PT;Supervision - Intermittent (OP PT for Vestibular Rehab)     Equipment Recommendations  None recommended by PT    Recommendations for Other Services       Precautions / Restrictions Precautions Precautions: None Restrictions Weight Bearing Restrictions: No    Mobility  Bed Mobility Overal bed mobility: Independent                Transfers Overall transfer level: Independent Equipment used: None                Ambulation/Gait                 Stairs            Wheelchair Mobility    Modified Rankin (Stroke Patients Only)       Balance                                            Cognition Arousal/Alertness: Awake/alert Behavior During Therapy: WFL for tasks assessed/performed;Restless Overall Cognitive Status: Within Functional Limits for tasks assessed                                        Vestibular Rehab Continued testing: *Dix-Hallpike:  Patient was negative with Rt Dix-Hallpike.   Repeated Lt Dix-Hallpike - test was negative (no nystagmus).  Patient reports minimal feeling of dizziness. *Compensation Technique:  Instructed patient on using targets to minimize dizziness at rest, and during mobility.    General Comments        Pertinent Vitals/Pain Pain Assessment: No/denies pain    Home Living                      Prior Function            PT Goals (current goals can now be found in the care plan section) Acute Rehab PT Goals Patient Stated Goal: To decrease dizziness Additional Goals Additional Goal #1: Patient will complete functional mobility activities with dizziness < 3/10. Progress towards PT goals: Goals met/education completed, patient discharged from PT    Frequency    Min 4X/week      PT Plan Current plan remains appropriate    Co-evaluation              AM-PAC PT "6 Clicks" Daily  Activity  Outcome Measure  Difficulty turning over in bed (including adjusting bedclothes, sheets and blankets)?: None Difficulty moving from lying on back to sitting on the side of the bed? : None Difficulty sitting down on and standing up from a chair with arms (e.g., wheelchair, bedside commode, etc,.)?: None Help needed moving to and from a bed to chair (including a wheelchair)?: None Help needed walking in hospital room?: None Help needed climbing 3-5 steps with a railing? : None 6 Click Score: 24    End of Session   Activity Tolerance: Patient tolerated treatment well Patient left: in bed;with call bell/phone within reach;with family/visitor present (sitting EOB) Nurse Communication: Mobility status (Need for f/u OP PT for Vestibular Rehab) PT Visit Diagnosis: BPPV;Dizziness and giddiness (R42) BPPV - Right/Left : Left     Time: 9407-6808 PT Time Calculation (min) (ACUTE ONLY): 19 min  Charges:  $Therapeutic Activity: 8-22 mins                    G Codes:       Carita Pian. Sanjuana Kava, [facility] Acute Rehab Services Pager  [place] 11/26/2016, 6:12 PM

## 2016-11-26 NOTE — Care Management Note (Signed)
Case Management Note  Patient Details  Name: Dustin Gardner MRN: [id number] Date of Birth: Apr 12, 1946  Subjective/Objective: CM received consult for pt to have Outpatient PT for Vestibular Rehab to further evaluate and Treat his ongoing Vertigo. Made electronic referral, made pt aware that he would be receiving call on Monday to schedule initial visit and confirmed home phone number on facesheet as correct. No further CM needs that I am aware of at this time.                 Action/Plan:CM will sign off for now but will be available should additional discharge needs arise or disposition change.    Expected Discharge Date:  11/26/16               Expected Discharge Plan:  OP Rehab  In-House Referral:     Discharge planning Services  CM Consult, Medication Assistance (Brilinta 30 Day Coupon)  Post Acute Care Choice:  NA Choice offered to:  Patient  DME Arranged:    DME Agency:     HH Arranged:    [place] Agency:     Status of Service:  Completed, signed off  If discussed at [employer], dates discussed:    Additional Comments:  Delrae Sawyers, RN 11/26/2016, 3:34 PM

## 2016-11-26 NOTE — Progress Notes (Addendum)
Physical Therapy Vestibular Assessment Data 11/26/16    11/26/16 1331  Vestibular Assessment  General Observation Patient reports his dizziness has been a chronic problem for several years.  Reports he has several episodes each year that last 1-2 days.  Improve with 1-2 days of rest.  Patient reports he has tinnitus, and some hearing loss bilaterally.  Patient also reports he has chronic sinus infections.  Symptom Behavior  Type of Dizziness Spinning  Frequency of Dizziness Intermittent throughout day.  Multiple episodes per year.  Duration of Dizziness Intense spinning typically < 60 seconds, with underlying dizziness.  This lasts 1-2 days.  Aggravating Factors Spontaneous onset;Turning head quickly;Supine to sit;Forward bending  Relieving Factors Head stationary;Rest;Closing eyes (Focusing on object)  Occulomotor Exam  Occulomotor Alignment Normal  Spontaneous Absent  Gaze-induced Absent  Head shaking Horizontal Absent  Head Shaking Vertical Absent  Smooth Pursuits Intact  Saccades Intact  Vestibulo-Occular Reflex  VOR 1 Head Only (x 1 viewing) Normal, with normal head thrust  Auditory  Comments Decreased hearing bilaterally - chronic.  Tinnitus.  Positional Testing  Dix-Hallpike Dix-Hallpike Left  Horizontal Canal Testing Horizontal Canal Right;Horizontal Canal Left  Dix-Hallpike Left  Dix-Hallpike Left Duration 30 seconds of intense symptoms  Dix-Hallpike Left Symptoms Upbeat, left rotatory nystagmus  Horizontal Canal Right  Horizontal Canal Right Duration 0  Horizontal Canal Right Symptoms Normal  Horizontal Canal Left  Horizontal Canal Left Duration 0  Horizontal Canal Left Symptoms Normal  Cognition  Cognition Orientation Level Oriented x 4  Positional Sensitivities  Supine to Sitting 3 (Supine to long-sitting )  Carita Pian. Sanjuana Kava, [place] Pager [phone]

## 2016-11-26 NOTE — Discharge Summary (Signed)
Discharge Summary    Patient ID: Dustin Gardner,  MRN: [id number], DOB/AGE: September 18, 1945 71 y.o.  Admit date: 11/24/2016 Discharge date: 11/26/2016  Primary Care Provider: Patient, No Pcp Per Primary Cardiologist: New to Dr. Burt Knack  Discharge Diagnoses    Active Problems:   Acute ST elevation myocardial infarction (STEMI) involving left anterior descending (LAD) coronary artery without development of Q waves (HCC)   STEMI involving left anterior descending coronary artery (HCC)   Dizziness  Allergies Allergies  Allergen Reactions  . Wellbutrin [Bupropion Hcl] Rash    Diagnostic Studies/Procedures    Coronary/Graft Acute MI Revascularization   11/24/16  Left Heart Cath and Coronary Angiography  Conclusion   1. Severe single vessel coronary artery disease with subtotal occlusion of the proximal LAD, treated with primary PCI using a drug-eluting stent platform 2. Moderate stenosis of the distal RCA bifurcation, appropriate for medical therapy 3. Patent left main and left circumflex 4. Moderately severe LV segmental dysfunction with LVEF estimated at 35%  Recommendations: Post MI medical therapy. Will continue Aggrastat 4 hours. The patient has been loaded with brilinta and should continue aspirin and brilinta for 12 months without interruption. Will check an echocardiogram for further assessment of LVEF and ongoing risk stratification.   Echo 11/25/16 Study Conclusions  - Left ventricle: The cavity size was normal. Wall thickness was   normal. Systolic function was mildly reduced. The estimated   ejection fraction was in the range of 45% to 50%. There is   hypokinesis of the anteroseptal myocardium. Doppler parameters   are consistent with abnormal left ventricular relaxation (grade 1   diastolic dysfunction). - Aortic valve: There was mild regurgitation directed towards the   mitral anterior leaflet. - Aorta: Aortic root dimension: 38 mm (ED). - Ascending aorta: The  ascending aorta was mildly dilated.   History of Present Illness     Dustin Gardner is a 71 y.o. male with a history of allergies and BPH and no past cardiac history who presented to [facility] via EMS on 11/24/16 with anterior STEMI.   He has no past cardiac history but does have a strong family history of CAD. Both his brothers and fathers have had stents placed in their 20s. He is a never smoker and drinks 2 beers a day. He follows regularly with a medical provider and has never been diagnosed with HTN, HLD, DMT2, CVA/TIA, or bleeding problems. He has had no recent surgeries.   He was in his usual state of health until the past 1-2 weeks when he started noticing intermittent chest pain that he attributed to GERD. Once time it was worse with exertion. Today he was sitting at rest when he had sudden onset of severe 10/10 chest pain that radiated to his left arm associated with diaphoresis. He called EMS and was found to have acute anterior STEMI and was taken to [facility] cath lab.   Hospital Course     Consultants: None  Cardiac catheterization revealed occluded LAD s/p PTCA and DES. Moderate stenosis of the distal RCA bifurcation -->  medical therapy.  Ejection fraction 35% by cath. Echo showed improved (next day of PCI) LVEF of 45-50%, grade 1 DD, aortic root dilation of 24mm Hg. Peak of troponin 2.8. No sign of CHF. Recommended repeat echo in several months.   Patient complained on dizziness. The dizziness is not related to his blood pressure and it predates this admission. Pt with decrease in bp while standing  (Sitting: 98/55; Standing:  82/64) but clinical has no complaints of any dizziness or feeling light headed while ambulation with cardiac rehab. Patient also has dizziness with PT prior to discharge. Patient tested positive [facility]) with Lt posterior canal canalithiasis.  Treated using Epley canal repositioning maneuver.  Case manage will arrange outpatient Vestibular Therapy.   The patient has  been seen by Dr. Sallyanne Kuster  today and deemed ready for discharge home. All follow-up appointments have been scheduled. Discharge medications are listed below.    Discharge Vitals Blood pressure 96/64, pulse 78, temperature 98.1 F (36.7 C), resp. rate 12, height 5\' 7"  (1.702 m), weight 141 lb (64 kg), SpO2 97 %.  Filed Weights   11/24/16 1800 11/25/16 0600 11/26/16 0420  Weight: 142 lb 10.2 oz (64.7 kg) 142 lb 13.7 oz (64.8 kg) 141 lb (64 kg)    Labs & Radiologic Studies     CBC  Recent Labs  11/25/16 0911 11/26/16 0246  WBC 5.9 6.4  HGB 13.0 13.0  HCT 38.7* 38.8*  MCV 93.5 92.6  PLT 171 341   Basic Metabolic Panel  Recent Labs  11/25/16 0911 11/26/16 0246  NA 139 137  K 3.8 3.6  CL 112* 107  CO2 22 23  GLUCOSE 94 103*  BUN 7 11  CREATININE 0.76 0.98  CALCIUM 8.6* 8.7*   Liver Function Tests  Recent Labs  11/24/16 1710  AST 24  ALT 17  ALKPHOS 40  BILITOT 0.7  PROT 6.3*  ALBUMIN 3.8   No results for input(s): LIPASE, AMYLASE in the last 72 hours. Cardiac Enzymes  Recent Labs  11/24/16 1938 11/25/16 0226 11/25/16 0911  TROPONINI 1.21* 2.81* 1.39*   BNP Invalid input(s): POCBNP D-Dimer No results for input(s): DDIMER in the last 72 hours. Hemoglobin A1C  Recent Labs  11/24/16 1938  HGBA1C 5.1   Fasting Lipid Panel  Recent Labs  11/25/16 0226  CHOL 168  HDL 48  LDLCALC 102*  TRIG 91  CHOLHDL 3.5   Thyroid Function Tests No results for input(s): TSH, T4TOTAL, T3FREE, THYROIDAB in the last 72 hours.  Invalid input(s): FREET3  No results found.  Disposition   Pt is being discharged home today in good condition.  Follow-up Plans & Appointments    Follow-up Information    Sherren Mocha, MD Follow up.   Specialty:  Cardiology Why:  office will call with date and time of appointment. If you did not hear anyting in 2 days, please call to make appointment  Contact information: [address] [place]  [address] [phone]          Discharge Instructions    Amb Referral to Cardiac Rehabilitation    Complete by:  As directed    Diagnosis:   Coronary Stents STEMI     Diet - low sodium heart healthy    Complete by:  As directed    Discharge instructions    Complete by:  As directed    NO HEAVY LIFTING (>10lbs) X 2 WEEKS. NO SEXUAL ACTIVITY X 2 WEEKS. NO DRIVING X 1 WEEK. NO SOAKING BATHS, HOT TUBS, POOLS, ETC., X 7 DAYS.   Increase activity slowly    Complete by:  As directed       Discharge Medications   Current Discharge Medication List    START taking these medications   Details  aspirin 81 MG chewable tablet Chew 1 tablet (81 mg total) by mouth daily. Qty: 90 tablet, Refills: 3    atorvastatin (  LIPITOR) 80 MG tablet Take 1 tablet (80 mg total) by mouth daily at 6 PM. Qty: 90 tablet, Refills: 3    carvedilol (COREG) 3.125 MG tablet Take 1 tablet (3.125 mg total) by mouth 2 (two) times daily with a meal. Qty: 60 tablet, Refills: 6    irbesartan (AVAPRO) 75 MG tablet Take 1 tablet (75 mg total) by mouth daily. Qty: 30 tablet, Refills: 6    nitroGLYCERIN (NITROSTAT) 0.4 MG SL tablet Place 1 tablet (0.4 mg total) under the tongue every 5 (five) minutes x 3 doses as needed for chest pain. Qty: 25 tablet, Refills: 12    ticagrelor (BRILINTA) 90 MG TABS tablet Take 1 tablet (90 mg total) by mouth 2 (two) times daily. Qty: 180 tablet, Refills: 3      CONTINUE these medications which have NOT CHANGED   Details  amphetamine-dextroamphetamine (ADDERALL) 10 MG tablet Take 10 mg by mouth See admin instructions. Take 1 tablet (10 mg) by mouth up to 3 times daily as needed to focus for meetings or activities Refills: 0    clonazePAM (KLONOPIN) 1 MG tablet Take 0.5-1 mg by mouth at bedtime.     fluticasone (FLONASE) 50 MCG/ACT nasal spray Place 2 sprays into the nose daily. Qty: 16 g, Refills: 12   Associated Diagnoses: Rhinitis    guaiFENesin (MUCINEX) 600 MG 12 hr  tablet Take 600 mg by mouth 2 (two) times daily as needed for to loosen phlegm.     loratadine (CLARITIN) 10 MG tablet Take 10 mg by mouth at bedtime.     Multiple Vitamin (MULTIVITAMIN WITH MINERALS) TABS tablet Take 1 tablet by mouth daily.    terazosin (HYTRIN) 2 MG capsule Take 1 capsule (2 mg total) by mouth at bedtime. Qty: 30 capsule, Refills: 12   Associated Diagnoses: BPH (benign prostatic hyperplasia)      STOP taking these medications     ibuprofen (ADVIL,MOTRIN) 200 MG tablet      amphetamine-dextroamphetamine (ADDERALL) 15 MG tablet          Aspirin prescribed at discharge?  Yes High Intensity Statin Prescribed? (Lipitor 40-80mg  or Crestor 20-40mg ): Yes Beta Blocker Prescribed? Yes For EF 45% or less, Was ACEI/ARB Prescribed? Yes ADP Receptor Inhibitor Prescribed? (i.e. Plavix etc.-Includes Medically Managed Patients): Yes For EF <40%, Aldosterone Inhibitor Prescribed? N/A no sign of CHF Was EF assessed during THIS hospitalization? Yes Was Cardiac Rehab II ordered? (Included Medically managed Patients): Yes   Outstanding Labs/Studies   Consider OP f/u labs 6-8 weeks given statin initiation this admission.  Duration of Discharge Encounter   Greater than 30 minutes including physician time.  Signed, Saraann Enneking PA-C 11/26/2016, 3:33 PM

## 2016-11-26 NOTE — Care Management Note (Signed)
Case Management Note  Patient Details  Name: Dustin Gardner MRN: [id number] Date of Birth: [birth year]  Subjective/Objective:  71 y.o. M to be discharged with new Rx Brilinta. Bedside RN has given coupon for 30Day free to be given to retail pharmacy with hard copy of Rx. CM attempted to call pt and explain without success.CM will sign off for now but will be available should additional discharge needs arise or disposition change.                   Action/Plan:   Expected Discharge Date:                  Expected Discharge Plan:  Home/Self Care  In-House Referral:     Discharge planning Services  CM Consult, Medication Assistance (Brilinta 30 Day Coupon)  Post Acute Care Choice:  NA Choice offered to:  Patient  DME Arranged:    DME Agency:     HH Arranged:    [place] Agency:     Status of Service:  Completed, signed off  If discussed at [employer], dates discussed:    Additional Comments:  Delrae Sawyers, RN 11/26/2016, 10:09 AM

## 2016-11-27 ENCOUNTER — Encounter (HOSPITAL_COMMUNITY): Payer: Self-pay | Admitting: Cardiovascular Disease

## 2016-11-27 ENCOUNTER — Telehealth (HOSPITAL_COMMUNITY): Payer: Self-pay

## 2016-11-27 NOTE — Telephone Encounter (Signed)
S/w Levada Dy through [employer] verifying insurance benefits No Coinsurance or Deductible, Copay $15.00 Out of Pocket $3400.00, pt has not met any of his OOP. Reference #[id number].... KJ

## 2016-12-05 NOTE — Progress Notes (Signed)
Cardiology Office Note    Date:  12/07/2016   ID:  Nur, Krasinski 1945-07-29, MRN [id number]  PCP:  Fanny Bien, MD  Cardiologist:  Dr. Burt Knack  Chief Complaint: Hospital follow for STEMI  History of Present Illness:   Dustin Gardner is a 71 y.o. male presents for hospital follow up.   He has no past cardiac history but does have a strong family history of CAD who admitted recently with STEMI. Cardiac catheterization revealed occluded LAD s/p PTCA and DES. Moderate stenosis of the distal RCA bifurcation -->  medical therapy.  Ejection fraction 35% by cath. Echo showed improved (next day of PCI) LVEF of 45-50%, grade 1 DD, aortic root dilation of 76mm Hg. Peak of troponin 2.8. No sign of CHF. Recommended repeat echo in several months. Patient has been dealing with dizziness chronically. Positive Dix Hallpike test. Plan for outpatient outpatient Vestibular Therapy.   Here today for follow up. He has been walking and doing exercise without any dyspnea or chest pain. His dizziness has been almost resolved with physical therapy. He denies orthopnea, PND, syncope, lower extremity edema, melena or blood in his stool or urine. Compliant with medication.   Past Medical History:  Diagnosis Date  . Allergy   . BPH (benign prostatic hyperplasia)   . Chronic sinusitis     Past Surgical History:  Procedure Laterality Date  . CORONARY/GRAFT ACUTE MI REVASCULARIZATION N/A 11/24/2016   Procedure: Coronary/Graft Acute MI Revascularization;  Surgeon: Sherren Mocha, MD;  Location: [facility];  Service: Cardiovascular;  Laterality: N/A;  . HERNIA REPAIR    . LEFT HEART CATH AND CORONARY ANGIOGRAPHY N/A 11/24/2016   Procedure: Left Heart Cath and Coronary Angiography;  Surgeon: Sherren Mocha, MD;  Location: [facility];  Service: Cardiovascular;  Laterality: N/A;    Current Medications: Prior to Admission medications   Medication Sig Start Date End Date Taking? Authorizing  Provider  amphetamine-dextroamphetamine (ADDERALL) 10 MG tablet Take 10 mg by mouth See admin instructions. Take 1 tablet (10 mg) by mouth up to 3 times daily as needed to focus for meetings or activities 11/10/16   [provider]  aspirin 81 MG chewable tablet Chew 1 tablet (81 mg total) by mouth daily. 11/27/16   Leanor Kail, PA  atorvastatin (LIPITOR) 80 MG tablet Take 1 tablet (80 mg total) by mouth daily at 6 PM. 11/26/16   Tyquavious Gamel, PA  carvedilol (COREG) 3.125 MG tablet Take 1 tablet (3.125 mg total) by mouth 2 (two) times daily with a meal. 11/26/16   Tikesha Mort, PA  clonazePAM (KLONOPIN) 1 MG tablet Take 0.5-1 mg by mouth at bedtime.     [provider]  fluticasone (FLONASE) 50 MCG/ACT nasal spray Place 2 sprays into the nose daily. Patient taking differently: Place 1-2 sprays into both nostrils at bedtime.  10/09/12   Posey Boyer, MD  guaiFENesin (MUCINEX) 600 MG 12 hr tablet Take 600 mg by mouth 2 (two) times daily as needed for to loosen phlegm.     [provider]  irbesartan (AVAPRO) 75 MG tablet Take 1 tablet (75 mg total) by mouth daily. 11/27/16   Gesenia Bantz, Crista Luria, PA  loratadine (CLARITIN) 10 MG tablet Take 10 mg by mouth at bedtime.     [provider]  Multiple Vitamin (MULTIVITAMIN WITH MINERALS) TABS tablet Take 1 tablet by mouth daily.    [provider]  nitroGLYCERIN (NITROSTAT) 0.4 MG SL tablet Place 1  tablet (0.4 mg total) under the tongue every 5 (five) minutes x 3 doses as needed for chest pain. 11/26/16   Skyelar Swigart, Crista Luria, PA  terazosin (HYTRIN) 2 MG capsule Take 1 capsule (2 mg total) by mouth at bedtime. 09/02/14   Roselee Culver, MD  ticagrelor (BRILINTA) 90 MG TABS tablet Take 1 tablet (90 mg total) by mouth 2 (two) times daily. 11/26/16   Leanor Kail, PA    Allergies:   Wellbutrin [bupropion hcl]   Social History   Social History  . Marital status: Single    Spouse name:  N/A  . Number of children: N/A  . Years of education: N/A   Social History Main Topics  . Smoking status: Never Smoker  . Smokeless tobacco: Never Used  . Alcohol use 7.0 oz/week    14 Standard drinks or equivalent per week  . Drug use: No  . Sexual activity: Yes   Other Topics Concern  . None   Social History Narrative   Works as a Engineer, water.      Family History:  The patient's family history includes Cancer in his mother; Heart disease in his brother and father.   ROS:   Please see the history of present illness.    ROS All other systems reviewed and are negative.   PHYSICAL EXAM:   VS:  BP 100/60   Pulse 71   Ht 5\' 7"  (1.702 m)   Wt 145 lb 12.8 oz (66.1 kg)   BMI 22.84 kg/m    GEN: Well nourished, well developed, in no acute distress  HEENT: normal  Neck: no JVD, carotid bruits, or masses Cardiac:RRR; no murmurs, rubs, or gallops,no edema  Respiratory:  clear to auscultation bilaterally, normal work of breathing GI: soft, nontender, nondistended, + BS MS: no deformity or atrophy  Skin: warm and dry, no rash Neuro:  Alert and Oriented x 3, Strength and sensation are intact Psych: euthymic mood, full affect  Wt Readings from Last 3 Encounters:  12/07/16 145 lb 12.8 oz (66.1 kg)  11/26/16 141 lb (64 kg)  09/02/14 143 lb (64.9 kg)      Studies/Labs Reviewed:   EKG:  EKG is ordered today.  The ekg ordered today demonstrates NSR with RBBB  Recent Labs: 11/24/2016: ALT 17; B Natriuretic Peptide 71.0 11/26/2016: BUN 11; Creatinine, Ser 0.98; Hemoglobin 13.0; Platelets 178; Potassium 3.6; Sodium 137   Lipid Panel    Component Value Date/Time   CHOL 168 11/25/2016 0226   TRIG 91 11/25/2016 0226   HDL 48 11/25/2016 0226   CHOLHDL 3.5 11/25/2016 0226   VLDL 18 11/25/2016 0226   LDLCALC 102 (H) 11/25/2016 0226    Additional studies/ records that were reviewed today include:   Coronary/Graft Acute MI Revascularization   11/24/16  Left Heart Cath and  Coronary Angiography  Conclusion   1. Severe single vessel coronary artery disease with subtotal occlusion of the proximal LAD, treated with primary PCI using a drug-eluting stent platform 2. Moderate stenosis of the distal RCA bifurcation, appropriate for medical therapy 3. Patent left main and left circumflex 4. Moderately severe LV segmental dysfunction with LVEF estimated at 35%  Recommendations: Post MI medical therapy. Will continue Aggrastat 4 hours. The patient has been loaded with brilinta and should continue aspirin and brilinta for 12 months without interruption. Will check an echocardiogram for further assessment of LVEF and ongoing risk stratification.   Echo 11/25/16 Study Conclusions  - Left ventricle: The cavity size was normal. Wall  thickness was normal. Systolic function was mildly reduced. The estimated ejection fraction was in the range of 45% to 50%. There is hypokinesis of the anteroseptal myocardium. Doppler parameters are consistent with abnormal left ventricular relaxation (grade 1 diastolic dysfunction). - Aortic valve: There was mild regurgitation directed towards the mitral anterior leaflet. - Aorta: Aortic root dimension: 38 mm (ED). - Ascending aorta: The ascending aorta was mildly dilated.     ASSESSMENT & PLAN:    1. CAD s/p PTCA and DES to ALD - Medical therapy for Moderate stenosis of the distal RCA bifurcation. No angina or dyspnea. Continue aspirin, Brilinta, Lipitor, beta blocker and Avapro.  2. Chronic dizziness - Symptoms almost resolved with vestibular therapy.   3. HLD - 11/25/2016: Cholesterol 168; HDL 48; LDL Cholesterol 102; Triglycerides 91; VLDL 18  - Lipid panel and LFT in 6 weeks.   4. LV dysfunction  -  Ejection fraction 35% by cath. Echo showed improved (next day of PCI) LVEF of 45-50%, grade 1 DD, aortic root dilation of 24mm Hg. Consider repeat echo during follow up. No signs of heart failure. Education  given. - Continue BB and ARB.    Medication Adjustments/Labs and Tests Ordered: Current medicines are reviewed at length with the patient today.  Concerns regarding medicines are outlined above.  Medication changes, Labs and Tests ordered today are listed in the Patient Instructions below. Patient Instructions  Medication Instructions:  Your physician recommends that you continue on your current medications as directed. Please refer to the Current Medication list given to you today.   Labwork: 4 WEEKS:  FASTING LIPID & LFT  Testing/Procedures: None ordered  Follow-Up: Your physician recommends that you schedule a follow-up appointment in: 3-4 MONTHS WITH DR. Burt Knack   Any Other Special Instructions Will Be Listed Below (If Applicable).    If you need a refill on your cardiac medications before your next appointment, please call your pharmacy.      Dustin Gardner, [place]  12/07/2016 8:57 AM    [facility] [place], [place], [place]  [address] Phone: [phone]; Fax: [phone]

## 2016-12-07 ENCOUNTER — Encounter: Payer: Self-pay | Admitting: Physician Assistant

## 2016-12-07 ENCOUNTER — Ambulatory Visit (INDEPENDENT_AMBULATORY_CARE_PROVIDER_SITE_OTHER): Payer: PPO | Admitting: Physician Assistant

## 2016-12-07 VITALS — BP 100/60 | HR 71 | Ht 67.0 in | Wt 145.8 lb

## 2016-12-07 DIAGNOSIS — R42 Dizziness and giddiness: Secondary | ICD-10-CM

## 2016-12-07 DIAGNOSIS — Z95820 Peripheral vascular angioplasty status with implants and grafts: Secondary | ICD-10-CM

## 2016-12-07 DIAGNOSIS — I5022 Chronic systolic (congestive) heart failure: Secondary | ICD-10-CM

## 2016-12-07 DIAGNOSIS — Z959 Presence of cardiac and vascular implant and graft, unspecified: Secondary | ICD-10-CM

## 2016-12-07 DIAGNOSIS — E785 Hyperlipidemia, unspecified: Secondary | ICD-10-CM

## 2016-12-07 DIAGNOSIS — I2102 ST elevation (STEMI) myocardial infarction involving left anterior descending coronary artery: Secondary | ICD-10-CM

## 2016-12-07 NOTE — Patient Instructions (Addendum)
Medication Instructions:  Your physician recommends that you continue on your current medications as directed. Please refer to the Current Medication list given to you today.   Labwork: 4 WEEKS:  FASTING LIPID & LFT  Testing/Procedures: None ordered  Follow-Up: Your physician recommends that you schedule a follow-up appointment in: 3-4 MONTHS WITH DR. Burt Knack   Any Other Special Instructions Will Be Listed Below (If Applicable).    If you need a refill on your cardiac medications before your next appointment, please call your pharmacy.

## 2016-12-08 ENCOUNTER — Telehealth (HOSPITAL_COMMUNITY): Payer: Self-pay

## 2016-12-13 NOTE — Telephone Encounter (Signed)
C/B next week per pt didn't feel well... Dustin Gardner

## 2016-12-15 ENCOUNTER — Telehealth (HOSPITAL_COMMUNITY): Payer: Self-pay

## 2016-12-15 NOTE — Telephone Encounter (Signed)
I called and spoke to patient about scheduling for cardiac rehab. Patient stated that he spends his time in [place] in the summer and is not here in [place] for a long period of time. Patient wants to think about it and speak to his significant other and will call back when he is ready.

## 2017-01-04 ENCOUNTER — Other Ambulatory Visit: Payer: PPO

## 2017-01-16 ENCOUNTER — Other Ambulatory Visit: Payer: PPO

## 2017-01-16 DIAGNOSIS — E785 Hyperlipidemia, unspecified: Secondary | ICD-10-CM | POA: Diagnosis not present

## 2017-01-16 LAB — LIPID PANEL
CHOL/HDL RATIO: 2.2 ratio (ref 0.0–5.0)
Cholesterol, Total: 93 mg/dL — ABNORMAL LOW (ref 100–199)
HDL: 42 mg/dL (ref >39)
LDL CALC: 38 mg/dL (ref 0–99)
Triglycerides: 63 mg/dL (ref 0–149)
VLDL CHOLESTEROL CAL: 13 mg/dL (ref 5–40)

## 2017-01-16 LAB — HEPATIC FUNCTION PANEL
ALT: 52 IU/L — ABNORMAL HIGH (ref 0–44)
AST: 36 IU/L (ref 0–40)
Albumin: 4.3 g/dL (ref 3.5–4.8)
Alkaline Phosphatase: 75 IU/L (ref 39–117)
Bilirubin Total: 0.6 mg/dL (ref 0.0–1.2)
Bilirubin, Direct: 0.19 mg/dL (ref 0.00–0.40)
Total Protein: 6.4 g/dL (ref 6.0–8.5)

## 2017-02-14 ENCOUNTER — Encounter: Payer: Self-pay | Admitting: Cardiovascular Disease

## 2017-02-16 ENCOUNTER — Telehealth: Payer: Self-pay | Admitting: Cardiovascular Disease

## 2017-02-16 DIAGNOSIS — Z8249 Family history of ischemic heart disease and other diseases of the circulatory system: Secondary | ICD-10-CM

## 2017-02-16 NOTE — Telephone Encounter (Signed)
Patient calling and wanted to make Dr. Burt Knack aware that he recently found out that he has two brothers who have had abdominal aortic aneurysms and one of those brothers recently died after it ruptured. Patient is expressing concern and would like to know if he can be tested.   Patient also wants to report having no energy and extreme fatigue for the past 5 weeks. Patient wanting to know if his medications should be adjusted. Patient denies starting any new medicines in the last couple of months and denies having any other symptoms. Patient has an appointment with Dr. Burt Knack on 9/19 but would like recommendations prior to appointment. Patient made aware that the information would be sent to Dr. Burt Knack for review and recommendation.

## 2017-02-16 NOTE — Telephone Encounter (Signed)
Patient calling,states that he has some questions about his medications and would like to discuss family history. Please call.

## 2017-02-20 NOTE — Telephone Encounter (Signed)
Should have abdominal ultrasound of his aorta with such a strong family history. He should stay on same medications until he is seen in the office.

## 2017-02-22 ENCOUNTER — Telehealth: Payer: Self-pay | Admitting: Cardiovascular Disease

## 2017-02-22 NOTE — Telephone Encounter (Signed)
Called patient and made him aware of Dr. Antionette Char recommendations. Made patient aware that someone would be contacting him to schedule [place]. Patient verbalized understanding and thanked me for the call. Message sent to [facility] to schedule patient.

## 2017-02-22 NOTE — Telephone Encounter (Signed)
Called the patient and left a VM to call back to schedule his AAA duplex.

## 2017-03-05 ENCOUNTER — Ambulatory Visit (HOSPITAL_COMMUNITY)
Admission: RE | Admit: 2017-03-05 | Discharge: 2017-03-05 | Disposition: A | Discharge status: Home / Self Care | Payer: PPO | Source: Ambulatory Visit | Attending: Internal Medicine | Admitting: Internal Medicine

## 2017-03-05 DIAGNOSIS — I700 Atherosclerosis of aorta: Secondary | ICD-10-CM | POA: Diagnosis not present

## 2017-03-05 DIAGNOSIS — R1084 Generalized abdominal pain: Secondary | ICD-10-CM | POA: Diagnosis not present

## 2017-03-05 DIAGNOSIS — Z8249 Family history of ischemic heart disease and other diseases of the circulatory system: Secondary | ICD-10-CM | POA: Insufficient documentation

## 2017-03-28 ENCOUNTER — Ambulatory Visit (INDEPENDENT_AMBULATORY_CARE_PROVIDER_SITE_OTHER): Payer: PPO | Admitting: Cardiovascular Disease

## 2017-03-28 ENCOUNTER — Encounter: Payer: Self-pay | Admitting: Cardiovascular Disease

## 2017-03-28 VITALS — BP 96/60 | HR 80 | Ht 67.0 in | Wt 141.1 lb

## 2017-03-28 DIAGNOSIS — I2510 Atherosclerotic heart disease of native coronary artery without angina pectoris: Secondary | ICD-10-CM | POA: Diagnosis not present

## 2017-03-28 DIAGNOSIS — E785 Hyperlipidemia, unspecified: Secondary | ICD-10-CM | POA: Diagnosis not present

## 2017-03-28 MED ORDER — ATORVASTATIN CALCIUM 20 MG PO TABS: 20.0000 mg | ORAL_TABLET | Freq: Every day | ORAL | 3 refills | Status: DC

## 2017-03-28 NOTE — Patient Instructions (Addendum)
Medication Instructions:  1) DECREASE LIPITOR (atorvastatin) to 20 mg daily 2) STOP COREG  Labwork: Your provider recommends that you return for FASTING lab work prior to your appointment with Vin in 3 months.    Testing/Procedures: None  Follow-Up: Your provider recommends that you schedule a follow-up appointment in 3 MONTHS with Dr. Antionette Char assistant, Vin.  Your provider wants you to follow-up in: May, 2019 with Dr. Burt Knack. You will receive a reminder letter in the mail two months in advance. If you don't receive a letter, please call our office to schedule the follow-up appointment.    Any Other Special Instructions Will Be Listed Below (If Applicable).     If you need a refill on your cardiac medications before your next appointment, please call your pharmacy.

## 2017-03-28 NOTE — Progress Notes (Signed)
Cardiology Office Note Date:  03/28/2017   ID:  Gardner, Dustin 1946-03-28, MRN [id number]  PCP:  Fanny Bien, MD  Cardiologist:  Sherren Mocha, MD    Chief Complaint  Patient presents with  . Coronary Artery Disease     History of Present Illness: Dustin Gardner is a 71 y.o. male who presents for follow-up of coronary artery disease. The patient initially presented in May 2018 with an anterior STEMI.  The patient is here alone today. He complains of fatigue as well as weakness in his arms and legs. He denies shortness of breath, chest pain, chest pressure, leg swelling, orthopnea, palpitations, or near syncope. He feels like he is overmedicated. His blood pressure has been running low. He denies any bleeding problems on dual antiplatelet therapy. He is still able to get out and exercise but doesn't have the same energy that he use to.  Past Medical History:  Diagnosis Date  . Allergy   . BPH (benign prostatic hyperplasia)   . Chronic sinusitis     Past Surgical History:  Procedure Laterality Date  . CORONARY/GRAFT ACUTE MI REVASCULARIZATION N/A 11/24/2016   Procedure: Coronary/Graft Acute MI Revascularization;  Surgeon: Sherren Mocha, MD;  Location: [facility];  Service: Cardiovascular;  Laterality: N/A;  . HERNIA REPAIR    . LEFT HEART CATH AND CORONARY ANGIOGRAPHY N/A 11/24/2016   Procedure: Left Heart Cath and Coronary Angiography;  Surgeon: Sherren Mocha, MD;  Location: [facility];  Service: Cardiovascular;  Laterality: N/A;    Current Outpatient Prescriptions  Medication Sig Dispense Refill  . amphetamine-dextroamphetamine (ADDERALL) 10 MG tablet Take 5 mg by mouth 2 (two) times daily.    [place] Kitchen aspirin 81 MG chewable tablet Chew 1 tablet (81 mg total) by mouth daily. 90 tablet 3  . atorvastatin (LIPITOR) 20 MG tablet Take 1 tablet (20 mg total) by mouth daily at 6 PM. 90 tablet 3  . azelastine (ASTELIN) 0.1 % nasal spray Place 1 spray into both  nostrils daily. Use in each nostril as directed    . irbesartan (AVAPRO) 75 MG tablet Take 1 tablet (75 mg total) by mouth daily. 30 tablet 6  . loratadine (CLARITIN) 10 MG tablet Take 10 mg by mouth at bedtime.     . nitroGLYCERIN (NITROSTAT) 0.4 MG SL tablet Place 1 tablet (0.4 mg total) under the tongue every 5 (five) minutes x 3 doses as needed for chest pain. 25 tablet 12  . terazosin (HYTRIN) 2 MG capsule Take 1 capsule (2 mg total) by mouth at bedtime. 30 capsule 12  . ticagrelor (BRILINTA) 90 MG TABS tablet Take 1 tablet (90 mg total) by mouth 2 (two) times daily. 180 tablet 3   No current facility-administered medications for this visit.     Allergies:   Wellbutrin [bupropion hcl]   Social History:  The patient  reports that he has never smoked. He has never used smokeless tobacco. He reports that he drinks about 7.0 oz of alcohol per week . He reports that he does not use drugs.   Family History:  The patient's family history includes Cancer in his mother; Heart disease in his brother and father.    ROS:  Please see the history of present illness.  All other systems are reviewed and negative.    PHYSICAL EXAM: VS:  BP 96/60   Pulse 80   Ht 5\' 7"  (1.702 m)   Wt 64 kg (141 lb 1.9 oz)  SpO2 97%   BMI 22.10 kg/m  , BMI Body mass index is 22.1 kg/m. GEN: Well nourished, well developed, in no acute distress  HEENT: normal  Neck: no JVD, no masses. No carotid bruits Cardiac: RRR without murmur or gallop                Respiratory:  clear to auscultation bilaterally, normal work of breathing GI: soft, nontender, nondistended, + BS MS: no deformity or atrophy  Ext: no pretibial edema, pedal pulses 2+= bilaterally Skin: warm and dry, no rash Neuro:  Strength and sensation are intact Psych: euthymic mood, full affect  EKG:  EKG is not ordered today.  Recent Labs: 11/24/2016: B Natriuretic Peptide 71.0 11/26/2016: BUN 11; Creatinine, Ser 0.98; Hemoglobin 13.0; Platelets 178;  Potassium 3.6; Sodium 137 01/16/2017: ALT 52   Lipid Panel     Component Value Date/Time   CHOL 93 (L) 01/16/2017 0925   TRIG 63 01/16/2017 0925   HDL 42 01/16/2017 0925   CHOLHDL 2.2 01/16/2017 0925   CHOLHDL 3.5 11/25/2016 0226   VLDL 18 11/25/2016 0226   LDLCALC 38 01/16/2017 0925      Wt Readings from Last 3 Encounters:  03/28/17 64 kg (141 lb 1.9 oz)  12/07/16 66.1 kg (145 lb 12.8 oz)  11/26/16 64 kg (141 lb)     Cardiac Studies Reviewed: Abdominal duplex ultrasound 03/05/2017: Impressions Study is technically challenging secondary to overlying bowel gas. Duplex imaging of the abdominal aorta, common iliac and external iliac arteries reveals them to be normal caliber, without focal dilatation. There is atherosclerosis in the aorta and bilateral iliac arteries, without focal stenosis. Patent IVC. Technologist Notes Technically challenging study. Normal caliber abdominal aorta, common and external iliac arteries, without focal dilatation. Aorto-iliac atherosclerosis, without stenosis. Patent IVC.  2-D echocardiogram 11/25/2016: Study Conclusions  - Left ventricle: The cavity size was normal. Wall thickness was   normal. Systolic function was mildly reduced. The estimated   ejection fraction was in the range of 45% to 50%. There is   hypokinesis of the anteroseptal myocardium. Doppler parameters   are consistent with abnormal left ventricular relaxation (grade 1   diastolic dysfunction). - Aortic valve: There was mild regurgitation directed towards the   mitral anterior leaflet. - Aorta: Aortic root dimension: 38 mm (ED). - Ascending aorta: The ascending aorta was mildly dilated.  Cardiac catheterization 11/24/2016: Conclusion   1. Severe single vessel coronary artery disease with subtotal occlusion of the proximal LAD, treated with primary PCI using a drug-eluting stent platform 2. Moderate stenosis of the distal RCA bifurcation, appropriate for medical therapy 3.  Patent left main and left circumflex 4. Moderately severe LV segmental dysfunction with LVEF estimated at 35%  Recommendations: Post MI medical therapy. Will continue Aggrastat 4 hours. The patient has been loaded with brilinta and should continue aspirin and brilinta for 12 months without interruption. Will check an echocardiogram for further assessment of LVEF and ongoing risk stratification.   ASSESSMENT AND PLAN: 1.  CAD, native vessel, with old MI: Patient without anginal symptoms. He's tolerating dual antiplatelet therapy well. He is not tolerating post MI medical therapy with fairly profound fatigue likely as a side effect of his medications. His blood pressure is too low. I advised him to stop carvedilol. He will continue on Avapro for now. He will return in 3 months for follow-up.  2. Hyperlipidemia: The patient complains of muscle weakness in his arms and legs. He is on a high-dose atorvastatin with a  total cholesterol less than 100 mg/dL. I recommended to reduce his atorvastatin dose to 20 mg and follow-up with lipids and LFTs in 3 months.  3. Ischemic cardiomyopathy: LVEF is 45-50% on post MI echocardiogram. He has no signs of volume excess on exam, nor does he have symptoms of heart failure. Unfortunately he is unable to tolerate aggressive medical therapy.  Current medicines are reviewed with the patient today.  The patient does not have concerns regarding medicines.  Labs/ tests ordered today include:   Orders Placed This Encounter  Procedures  . Hepatic function panel  . Lipid panel    Disposition:   FU 3 months with Robbie Lis, PA-C, FU in May 2019 with me  Signed, Sherren Mocha, MD  03/28/2017 4:51 PM    [facility] [place], [place], [place]  [address] Phone: [phone]; Fax: [phone]

## 2017-04-11 NOTE — Telephone Encounter (Signed)
This encounter was created in error - please disregard.

## 2017-05-15 ENCOUNTER — Telehealth (HOSPITAL_COMMUNITY): Payer: Self-pay

## 2017-05-15 NOTE — Telephone Encounter (Signed)
I called and spoke with patient to follow up with Cardiac Rehab - Patient stated he is back from [place] but better than he has ever been. Hes doing what he is supposed to do per patient. Referral closed.

## 2017-05-25 DIAGNOSIS — N400 Enlarged prostate without lower urinary tract symptoms: Secondary | ICD-10-CM | POA: Diagnosis not present

## 2017-05-25 DIAGNOSIS — I252 Old myocardial infarction: Secondary | ICD-10-CM | POA: Diagnosis not present

## 2017-05-25 DIAGNOSIS — Z23 Encounter for immunization: Secondary | ICD-10-CM | POA: Diagnosis not present

## 2017-05-25 DIAGNOSIS — Z1211 Encounter for screening for malignant neoplasm of colon: Secondary | ICD-10-CM | POA: Diagnosis not present

## 2017-05-25 DIAGNOSIS — N529 Male erectile dysfunction, unspecified: Secondary | ICD-10-CM | POA: Diagnosis not present

## 2017-05-25 DIAGNOSIS — Z6823 Body mass index (BMI) 23.0-23.9, adult: Secondary | ICD-10-CM | POA: Diagnosis not present

## 2017-05-25 DIAGNOSIS — I10 Essential (primary) hypertension: Secondary | ICD-10-CM | POA: Diagnosis not present

## 2017-05-25 DIAGNOSIS — Z0000 Encounter for general adult medical examination without abnormal findings: Secondary | ICD-10-CM | POA: Diagnosis not present

## 2017-06-14 DIAGNOSIS — F341 Dysthymic disorder: Secondary | ICD-10-CM | POA: Diagnosis not present

## 2017-06-14 DIAGNOSIS — F902 Attention-deficit hyperactivity disorder, combined type: Secondary | ICD-10-CM | POA: Diagnosis not present

## 2017-06-15 ENCOUNTER — Encounter: Payer: Self-pay | Admitting: Physician Assistant

## 2017-06-20 ENCOUNTER — Other Ambulatory Visit: Payer: Self-pay

## 2017-06-20 MED ORDER — IRBESARTAN 75 MG PO TABS: 75.0000 mg | ORAL_TABLET | Freq: Every day | ORAL | 6 refills | Status: DC

## 2017-06-29 ENCOUNTER — Other Ambulatory Visit: Payer: PPO | Admitting: *Deleted

## 2017-06-29 DIAGNOSIS — I2510 Atherosclerotic heart disease of native coronary artery without angina pectoris: Secondary | ICD-10-CM | POA: Diagnosis not present

## 2017-06-29 LAB — LIPID PANEL
CHOL/HDL RATIO: 2.1 ratio (ref 0.0–5.0)
Cholesterol, Total: 125 mg/dL (ref 100–199)
HDL: 60 mg/dL (ref >39)
LDL CALC: 52 mg/dL (ref 0–99)
Triglycerides: 64 mg/dL (ref 0–149)
VLDL Cholesterol Cal: 13 mg/dL (ref 5–40)

## 2017-06-29 LAB — HEPATIC FUNCTION PANEL
ALBUMIN: 4.3 g/dL (ref 3.5–4.8)
ALK PHOS: 44 IU/L (ref 39–117)
ALT: 37 IU/L (ref 0–44)
AST: 28 IU/L (ref 0–40)
BILIRUBIN, DIRECT: 0.21 mg/dL (ref 0.00–0.40)
Bilirubin Total: 0.7 mg/dL (ref 0.0–1.2)
TOTAL PROTEIN: 6.4 g/dL (ref 6.0–8.5)

## 2017-07-05 ENCOUNTER — Encounter: Payer: Self-pay | Admitting: Physician Assistant

## 2017-07-05 DIAGNOSIS — I255 Ischemic cardiomyopathy: Secondary | ICD-10-CM

## 2017-07-05 DIAGNOSIS — E785 Hyperlipidemia, unspecified: Secondary | ICD-10-CM

## 2017-07-05 DIAGNOSIS — I2510 Atherosclerotic heart disease of native coronary artery without angina pectoris: Secondary | ICD-10-CM

## 2017-07-05 HISTORY — DX: Atherosclerotic heart disease of native coronary artery without angina pectoris: I25.10

## 2017-07-05 HISTORY — DX: Ischemic cardiomyopathy: I25.5

## 2017-07-05 HISTORY — DX: Hyperlipidemia, unspecified: E78.5

## 2017-07-05 NOTE — Progress Notes (Signed)
Cardiology Office Note:    Date:  07/06/2017   ID:  Dustin Gardner [birth year], MRN [id number]  PCP:  Fanny Bien, MD  Cardiologist:  Sherren Mocha, MD   Referring MD: Fanny Bien, MD   Chief Complaint  Patient presents with  . Chest Pain    History of Present Illness:    Dustin Gardner is a 71 y.o. male with a hx of CAD status post anterior ST elevation myocardial infarction in May 2018 treated with primary PCI using DES to the proximal LAD.  EF at time of cardiac catheterization was 35%.  Follow-up echocardiogram prior to discharge demonstrated EF 45-50% with anteroseptal hypokinesis and mild diastolic dysfunction and mild mitral regurgitation.  He was last seen by Dr. Burt Knack in September 2018.  At that time, he complained of fatigue in the setting of hypotension.  His carvedilol was discontinued.  He also complained of myalgias and his atorvastatin dose was reduced to 20 mg.  Follow-up lipids earlier this month demonstrated optimal LDL at 52.  Dustin Gardner returns for routine follow-up.  He is here alone.  Since last seen, he has noted progressively worsening chest tightness over the past 6-8 weeks.  This is not exacerbated by activity.  It comes on at rest.  He questions whether or not it may be related to meals.  He continues to exercise without symptoms.  He denies any radiating symptoms or associated nausea, diaphoresis, dyspnea.  He has taken nitroglycerin once without relief.  The symptoms may last 1-2 hours.  They are right-sided.  There is somewhat similar to his previous angina but not as severe.  He denies exertional dyspnea, syncope, orthopnea, PND or edema.  Prior CV studies:   The following studies were reviewed today:  AAA duplex 8/18 Normal caliber abdominal aorta, common and external iliac arteries without focal dilatation; aorto-iliac atherosclerosis without stenosis  Echocardiogram 5/18 EF 45-50, anteroseptal HK, grade 1 diastolic dysfunction, mild MR,  aortic root 38 mm, mildly dilated a sending aorta  Cardiac catheterization 5/18 LAD proximal 95-thrombotic LCx mild disease RCA distal 50; ostial RPDA 50; RPLB1 17 EF 35, anterolateral and periapical HK PCI: 3 x 20 mm Synergy DES to the proximal LAD  Past Medical History:  Diagnosis Date  . Allergy   . BPH (benign prostatic hyperplasia)   . Chronic sinusitis   . Coronary artery disease involving native coronary artery of native heart without angina pectoris 07/05/2017   Status post anterior ST elevation myocardial infarction 5/18: LHC -proximal LAD 95; distal RCA 50; ostial RPDA 50; R PLB1 70 with >> PCI: 3 x 20 mm Synergy DES to the proximal LAD  . History of ST elevation myocardial infarction (STEMI) 11/24/2016   Status post DES to the proximal LAD  . Hyperlipidemia with target LDL less than 70 07/05/2017   History of myalgias on higher dose atorvastatin  . Ischemic cardiomyopathy 07/05/2017   LHC 5/18: EF 35 with anterolateral and periapical hypokinesis //Echo 5/18: EF 45-50, anteroseptal HK, grade 1 diastolic dysfunction, mild MR, aortic root 38 mm, mildly dilated a sending aorta    Past Surgical History:  Procedure Laterality Date  . CORONARY/GRAFT ACUTE MI REVASCULARIZATION N/A 11/24/2016   Procedure: Coronary/Graft Acute MI Revascularization;  Surgeon: Sherren Mocha, MD;  Location: [facility];  Service: Cardiovascular;  Laterality: N/A;  . HERNIA REPAIR    . LEFT HEART CATH AND CORONARY ANGIOGRAPHY N/A 11/24/2016   Procedure: Left Heart Cath and Coronary Angiography;  Surgeon: Sherren Mocha, MD;  Location: [facility];  Service: Cardiovascular;  Laterality: N/A;    Current Medications: Current Meds  Medication Sig  . amphetamine-dextroamphetamine (ADDERALL) 10 MG tablet Take 5 mg by mouth 2 (two) times daily.  Marland Kitchen aspirin 81 MG chewable tablet Chew 1 tablet (81 mg total) by mouth daily.  Marland Kitchen atorvastatin (LIPITOR) 20 MG tablet Take 1 tablet (20 mg total) by  mouth daily at 6 PM.  . fluticasone (FLONASE) 50 MCG/ACT nasal spray Place 2 sprays into both nostrils daily.  . irbesartan (AVAPRO) 75 MG tablet Take 1 tablet (75 mg total) by mouth daily.  Marland Kitchen loratadine (CLARITIN) 10 MG tablet Take 10 mg by mouth at bedtime.   . nitroGLYCERIN (NITROSTAT) 0.4 MG SL tablet Place 1 tablet (0.4 mg total) under the tongue every 5 (five) minutes x 3 doses as needed for chest pain.  Marland Kitchen terazosin (HYTRIN) 2 MG capsule Take 1 capsule (2 mg total) by mouth at bedtime.  . ticagrelor (BRILINTA) 90 MG TABS tablet Take 1 tablet (90 mg total) by mouth 2 (two) times daily.     Allergies:   Wellbutrin [bupropion hcl]   Social History   Tobacco Use  . Smoking status: Never Smoker  . Smokeless tobacco: Never Used  Substance Use Topics  . Alcohol use: Yes    Alcohol/week: 7.0 oz    Types: 14 Standard drinks or equivalent per week  . Drug use: No     Family Hx: The patient's family history includes Cancer in his mother; Heart disease in his brother and father.  ROS:   Please see the history of present illness.    ROS All other systems reviewed and are negative.   EKGs/Labs/Other Test Reviewed:    EKG:  EKG is  ordered today.  The ekg ordered today demonstrates normal sinus rhythm, HR 67, normal axis, RBBB, QTC 441 ms, no change from prior tracing  Recent Labs: 11/24/2016: B Natriuretic Peptide 71.0 11/26/2016: BUN 11; Creatinine, Ser 0.98; Hemoglobin 13.0; Platelets 178; Potassium 3.6; Sodium 137 06/29/2017: ALT 37   Recent Lipid Panel Lab Results  Component Value Date/Time   CHOL 125 06/29/2017 09:22 AM   TRIG 64 06/29/2017 09:22 AM   HDL 60 06/29/2017 09:22 AM   CHOLHDL 2.1 06/29/2017 09:22 AM   CHOLHDL 3.5 11/25/2016 02:26 AM   LDLCALC 52 06/29/2017 09:22 AM    Physical Exam:    VS:  BP (!) 104/56   Pulse 68   Ht 5\' 7"  (1.702 m)   Wt 150 lb 1.9 oz (68.1 kg)   SpO2 96%   BMI 23.51 kg/m     Wt Readings from Last 3 Encounters:  07/06/17 150 lb  1.9 oz (68.1 kg)  03/28/17 141 lb 1.9 oz (64 kg)  12/07/16 145 lb 12.8 oz (66.1 kg)     Physical Exam  Constitutional: He is oriented to person, place, and time. He appears well-developed and well-nourished. No distress.  HENT:  Head: Normocephalic and atraumatic.  Eyes: No scleral icterus.  Neck: No JVD present.  Cardiovascular: Normal rate and regular rhythm.  No murmur heard. Pulmonary/Chest: Effort normal. He has no wheezes. He has no rales.  Abdominal: Soft. There is no tenderness.  Musculoskeletal: He exhibits no edema.  Neurological: He is alert and oriented to person, place, and time.  Skin: Skin is warm and dry.    ASSESSMENT:    1. Chest pain, unspecified type   2. Coronary artery disease involving  native coronary artery of native heart without angina pectoris   3. Ischemic cardiomyopathy   4. Hyperlipidemia with target LDL less than 70    PLAN:    In order of problems listed above:  1. Chest pain - He presents today with complaints of chest discomfort over the past 6-8 weeks.  It seems to have progressed and is now occurring daily.  It is somewhat similar to his previous angina but not quite as severe.  However, he has no associated symptoms and can exert himself without exacerbating his symptoms.  At the time of his cardiac catheterization, he did have residual distal RCA disease.  His blood pressure limits antianginal therapy.  There is a question of whether or not his symptoms may be exacerbated by meals.  -Obtain exercise nuclear stress test  -Start Protonix 40 mg daily times 2 weeks, then as needed  -Close follow-up 2-3 weeks  2. Coronary artery disease History of anterior ST elevation myocardial infarction May 2018 treated with DES to the LAD.  Continue aspirin, Brilinta, statin.  He was unable to tolerate beta-blocker secondary to hypotension.  As noted, he will undergo stress testing to evaluate chest pain.  3. Ischemic cardiomyopathy EF 45-50.  NYHA 1.  He  denies any symptoms consistent with clinical heart failure.  He was unable to tolerate beta-blockers.  Continue ARB therapy.  4. Hyperlipidemia with target LDL less than 70 LDL optimal on most recent lab work.  Continue current Rx.     Dispo:  Return in about 2 weeks (around 07/20/2017) for Close Follow Up, w/ Richardson Dopp, PA-C.   Medication Adjustments/Labs and Tests Ordered: Current medicines are reviewed at length with the patient today.  Concerns regarding medicines are outlined above.  Tests Ordered: Orders Placed This Encounter  Procedures  . Myocardial Perfusion Imaging  . EKG 12-Lead   Medication Changes: Meds ordered this encounter  Medications  . pantoprazole (PROTONIX) 40 MG tablet    Sig: Take 1 tablet (40 mg total) by mouth as directed. Take 1 tablet daily for 2 weeks then take only as needed    Dispense:  30 tablet    Refill:  2    Signed, Richardson Dopp, PA-C  07/06/2017 11:45 AM    [facility] [address], [place], [place]  [address] Phone: [phone]; Fax: [phone]

## 2017-07-06 ENCOUNTER — Encounter (INDEPENDENT_AMBULATORY_CARE_PROVIDER_SITE_OTHER): Payer: Self-pay

## 2017-07-06 ENCOUNTER — Encounter: Payer: Self-pay | Admitting: Physician Assistant

## 2017-07-06 ENCOUNTER — Ambulatory Visit: Payer: PPO | Admitting: Physician Assistant

## 2017-07-06 VITALS — BP 104/56 | HR 68 | Ht 67.0 in | Wt 150.1 lb

## 2017-07-06 DIAGNOSIS — R079 Chest pain, unspecified: Secondary | ICD-10-CM

## 2017-07-06 DIAGNOSIS — I2510 Atherosclerotic heart disease of native coronary artery without angina pectoris: Secondary | ICD-10-CM

## 2017-07-06 DIAGNOSIS — I255 Ischemic cardiomyopathy: Secondary | ICD-10-CM | POA: Diagnosis not present

## 2017-07-06 DIAGNOSIS — E785 Hyperlipidemia, unspecified: Secondary | ICD-10-CM

## 2017-07-06 MED ORDER — PANTOPRAZOLE SODIUM 40 MG PO TBEC: 40.0000 mg | DELAYED_RELEASE_TABLET | ORAL | 2 refills | Status: DC

## 2017-07-06 NOTE — Patient Instructions (Signed)
Medication Instructions:  1. START PROTONIX 40 MG DAILY FOR 2 WEEKS THEN TAKE ONLY AS NEEDED  Labwork: NONE ORDERED TODAY  Testing/Procedures: 1. Your physician has requested that you have en exercise stress myoview. For further information please visit [email or web address]. Please follow instruction sheet, as given.    Follow-Up: [facility], PAC SAME DAY DR. Burt Knack IS IN THE OFFICE 2-3 WEEKS AFTER MYOVIEW HAS BEEN COMPLETED  Any Other Special Instructions Will Be Listed Below (If Applicable).     If you need a refill on your cardiac medications before your next appointment, please call your pharmacy.

## 2017-07-09 ENCOUNTER — Telehealth (HOSPITAL_COMMUNITY): Payer: Self-pay | Admitting: *Deleted

## 2017-07-09 NOTE — Telephone Encounter (Signed)
Patient given detailed instructions per Myocardial Perfusion Study Information Sheet for the test on 07/11/17 at 0945. Patient notified to arrive 15 minutes early and that it is imperative to arrive on time for appointment to keep from having the test rescheduled.  If you need to cancel or reschedule your appointment, please call the office within 24 hours of your appointment. . Patient verbalized understanding.Garland Smouse, Ranae Palms

## 2017-07-11 ENCOUNTER — Ambulatory Visit (HOSPITAL_COMMUNITY): Payer: PPO | Attending: Cardiovascular Disease

## 2017-07-11 ENCOUNTER — Encounter (HOSPITAL_COMMUNITY): Payer: Self-pay

## 2017-07-11 DIAGNOSIS — R079 Chest pain, unspecified: Secondary | ICD-10-CM | POA: Diagnosis not present

## 2017-07-11 LAB — MYOCARDIAL PERFUSION IMAGING
CHL CUP MPHR: 148 {beats}/min
CSEPEW: 7 METS
Exercise duration (min): 6 min
LHR: 0.26
LVDIAVOL: 87 mL (ref 62–150)
LVSYSVOL: 33 mL
NUC STRESS TID: 1.14
Peak HR: 130 {beats}/min
Percent HR: 88 %
RPE: 17
Rest HR: 66 {beats}/min
SDS: 5
SRS: 1
SSS: 5

## 2017-07-11 MED ORDER — TECHNETIUM TC 99M TETROFOSMIN IV KIT
31.8000 | PACK | Freq: Once | INTRAVENOUS | Status: AC | PRN
  Administered 2017-07-11: 31.8 via INTRAVENOUS
  Filled 2017-07-11: qty 32

## 2017-07-11 MED ORDER — TECHNETIUM TC 99M TETROFOSMIN IV KIT
10.4000 | PACK | Freq: Once | INTRAVENOUS | Status: AC | PRN
  Administered 2017-07-11: 10.4 via INTRAVENOUS
  Filled 2017-07-11: qty 11

## 2017-07-12 ENCOUNTER — Encounter: Payer: Self-pay | Admitting: Physician Assistant

## 2017-07-30 NOTE — Progress Notes (Signed)
Cardiology Office Note:    Date:  07/31/2017   ID:  Dustin Gardner 1945/10/24, MRN [id number]  PCP:  Fanny Bien, MD  Cardiologist:  Sherren Mocha, MD   Referring MD: Fanny Bien, MD   Chief Complaint  Patient presents with  . Follow-up    chest pain    History of Present Illness:    Dustin Gardner is a 72 y.o. male with a past medical history significant for CAD S/P anterior STEMI in 11/2016 treated with DES to proximal LAD.  EF at the time of cardiac catheterization was 35%.  Follow-up echocardiogram prior to discharge demonstrated EF 45-50% with anterior septal hypokinesis, mild diastolic dysfunction and mild mitral regurgitation.  At follow-up in 03/2017, he complained of fatigue in the setting of hypotension and his carvedilol was discontinued.  He also complained of myalgias in his atorvastatin was reduced to 20 mg.  Follow-up lipids in December demonstrated optimal LDL at 52.  Dustin Gardner was last seen in the office by Dustin Dopp, PA on 07/06/17 at which time he complained of progressively worsening chest tightness over the previous 6-8 weeks.  This occurred mostly at rest and he questioned whether or not this was related to meals.  He was exercising without symptoms.  And this was nonradiating and not associated with nausea, diaphoresis or dyspnea.  It was noted that at the time of his cardiac catheterization he did have residual distal RCA disease and his blood pressure limits antianginal therapy.  Scott we have arranged for a nuclear stress test that was accomplished on 07/11/2017 and showed normal exercise tolerance, no evidence of prior infarct or ischemia, normal BP response to exertion, EF 62% and was considered a low risk study.  He was also started on Protonix 40 mg daily for 2 weeks and then as needed.  Dustin Gardner is here today alone for follow-up. He states that his symptoms are much improved, partly with the protonix, but mostly due to the fact that his mid was  eased by the normal stress test. He still has some vague occ chest tghtness and sometimes sharp pains that last for a few seconds. These occur unrelated to activity and he has not chest discomfort or shortness of breath with exercise. He exercises at a gym 2-3 times per week and also walks outside. 30 minute on a treadmill as well as using a rower and doing some upper body weight lifting. He has been trying to run some at 4 MPH and notes his HR goes up to 160-170. He only runs for a minute or so and his HR recovers quickly. I have asked him to focus on fast walking and avoid running. His HR only go up to 90-100 with walking.   Dustin Gardner does not smoke but drinks 2 glasses of wine every night. He is not following any type of diet although he feels that his diet is not very unhealthy. He does eat red meat.   Dustin Gardner blood pressure is low today, 80/50 and rechecked by me 86/58. He denies any lightheadedness, presyncope or syncope. He has had low BP's noted since his MI with needed medication adjustments. He had one low BP on his stress test, otherwise he had appropriate BP elevation.    Past Medical History:  Diagnosis Date  . Allergy   . BPH (benign prostatic hyperplasia)   . Chronic sinusitis   . Coronary artery disease involving native coronary artery of native heart without angina  pectoris 07/05/2017   Status post anterior ST elevation myocardial infarction 5/18: LHC -proximal LAD 95; distal RCA 50; ostial RPDA 50; R PLB1 70 with >> PCI: 3 x 20 mm Synergy DES to the proximal LAD // Nuclear stress test 1/19: EF 62, no infarct or ischemia, low risk   . History of ST elevation myocardial infarction (STEMI) 11/24/2016   Status post DES to the proximal LAD  . Hyperlipidemia with target LDL less than 70 07/05/2017   History of myalgias on higher dose atorvastatin  . Ischemic cardiomyopathy 07/05/2017   LHC 5/18: EF 35 with anterolateral and periapical hypokinesis //Echo 5/18: EF 45-50, anteroseptal  HK, grade 1 diastolic dysfunction, mild MR, aortic root 38 mm, mildly dilated a sending aorta    Past Surgical History:  Procedure Laterality Date  . CORONARY/GRAFT ACUTE MI REVASCULARIZATION N/A 11/24/2016   Procedure: Coronary/Graft Acute MI Revascularization;  Surgeon: Sherren Mocha, MD;  Location: [facility];  Service: Cardiovascular;  Laterality: N/A;  . HERNIA REPAIR    . LEFT HEART CATH AND CORONARY ANGIOGRAPHY N/A 11/24/2016   Procedure: Left Heart Cath and Coronary Angiography;  Surgeon: Sherren Mocha, MD;  Location: [facility];  Service: Cardiovascular;  Laterality: N/A;    Current Medications: Current Meds  Medication Sig  . amphetamine-dextroamphetamine (ADDERALL) 10 MG tablet Take 5 mg by mouth 2 (two) times daily.  Marland Kitchen aspirin 81 MG chewable tablet Chew 1 tablet (81 mg total) by mouth daily.  Marland Kitchen atorvastatin (LIPITOR) 20 MG tablet Take 1 tablet (20 mg total) by mouth daily at 6 PM.  . fluticasone (FLONASE) 50 MCG/ACT nasal spray Place 2 sprays into both nostrils daily.  Marland Kitchen loratadine (CLARITIN) 10 MG tablet Take 10 mg by mouth at bedtime.   . nitroGLYCERIN (NITROSTAT) 0.4 MG SL tablet Place 1 tablet (0.4 mg total) under the tongue every 5 (five) minutes x 3 doses as needed for chest pain.  Marland Kitchen terazosin (HYTRIN) 2 MG capsule Take 1 capsule (2 mg total) by mouth at bedtime.  . ticagrelor (BRILINTA) 90 MG TABS tablet Take 1 tablet (90 mg total) by mouth 2 (two) times daily.     Allergies:   Wellbutrin [bupropion hcl]   Social History   Socioeconomic History  . Marital status: Single    Spouse name: None  . Number of children: None  . Years of education: None  . Highest education level: None  Social Needs  . Financial resource strain: None  . Food insecurity - worry: None  . Food insecurity - inability: None  . Transportation needs - medical: None  . Transportation needs - non-medical: None  Occupational History  . None  Tobacco Use  . Smoking  status: Never Smoker  . Smokeless tobacco: Never Used  Substance and Sexual Activity  . Alcohol use: Yes    Alcohol/week: 7.0 oz    Types: 14 Standard drinks or equivalent per week  . Drug use: No  . Sexual activity: Yes  Other Topics Concern  . None  Social History Narrative   Works as a Engineer, water.      Family History: The patient's family history includes Cancer in his mother; Heart disease in his brother and father. ROS:   Please see the history of present illness.     All other systems reviewed and are negative.  EKGs/Labs/Other Studies Reviewed:    The following studies were reviewed today:  Nuclear stress test 07/11/2017 Study Highlights    Nuclear stress EF: 62%.  Blood pressure demonstrated a normal response to exercise.  There was no ST segment deviation noted during stress.  The study is normal.  This is a low risk study.  The left ventricular ejection fraction is normal (55-65%). Normal exercise nuclear stress test with no evidence for prior infarct or ischemia. Normal exercise tolerance. Normal BP response to exertion.    AAA duplex 8/18 Normal caliber abdominal aorta, common and external iliac arteries without focal dilatation; aorto-iliac atherosclerosis without stenosis  Echocardiogram 5/18 EF 45-50, anteroseptal HK, grade 1 diastolic dysfunction, mild MR, aortic root 38 mm, mildly dilated a sending aorta  Cardiac catheterization 5/18 LAD proximal 95-thrombotic LCx mild disease RCA distal 50; ostial RPDA 50; RPLB1 70 EF 35, anterolateral and periapical HK PCI: 3 x 20 mm Synergy DES to the proximal LAD  EKG:  EKG is not ordered today.    Recent Labs: 11/24/2016: B Natriuretic Peptide 71.0 11/26/2016: BUN 11; Creatinine, Ser 0.98; Hemoglobin 13.0; Platelets 178; Potassium 3.6; Sodium 137 06/29/2017: ALT 37   Recent Lipid Panel    Component Value Date/Time   CHOL 125 06/29/2017 0922   TRIG 64 06/29/2017 0922   HDL 60 06/29/2017 0922    CHOLHDL 2.1 06/29/2017 0922   CHOLHDL 3.5 11/25/2016 0226   VLDL 18 11/25/2016 0226   LDLCALC 52 06/29/2017 0922    Physical Exam:    VS:  BP (!) 80/50 -rechecked by me 86/58  Pulse 78   Ht 5\' 7"  (1.702 m)   Wt 150 lb 12.8 oz (68.4 kg)   SpO2 98%   BMI 23.62 kg/m     Wt Readings from Last 3 Encounters:  07/31/17 150 lb 12.8 oz (68.4 kg)  07/11/17 150 lb (68 kg)  07/06/17 150 lb 1.9 oz (68.1 kg)     Physical Exam  Constitutional: He is oriented to person, place, and time. He appears well-developed and well-nourished. No distress.  HENT:  Head: Normocephalic and atraumatic.  Neck: Normal range of motion. Neck supple. No JVD present.  Cardiovascular: Normal rate, regular rhythm and normal heart sounds. Exam reveals no gallop and no friction rub.  No murmur heard. Pulmonary/Chest: Effort normal and breath sounds normal. No respiratory distress. He has no wheezes. He has no rales.  Abdominal: Soft. Bowel sounds are normal. He exhibits no distension. There is no tenderness.  Musculoskeletal: Normal range of motion. He exhibits no edema.  Neurological: He is alert and oriented to person, place, and time.  Skin: Skin is warm and dry.  Psychiatric: He has a normal mood and affect. His behavior is normal. Thought content normal.    ASSESSMENT:    1. Other chest pain   2. Coronary artery disease involving native coronary artery of native heart without angina pectoris   3. Ischemic cardiomyopathy   4. Hyperlipidemia with target LDL less than 70    PLAN:    In order of problems listed above:  1. Chest pain: Continues to have vague atypical chest discomfort, but much improved with protonix and had a low risk stress test which eased his mind and he feels improved his symptoms.   2. CAD: S/P STEMI in 11/2016 treated with DES to LAD. Continues on aspirin, Brilinta and statin. Unable to tolerate BB secondary to hypotension. Pt advised to reduce alcohol consumption to fewer glasses per  day or reduce frequency. He does not want to abstain. Advised on heart healthy diet. Continue exercise.   3. Ischemic cardiomyopathy: EF 45/50% by echo in 11/2016. Recent  nuclear stress test showed EF 62%. No clinical symptoms of heart failure. Unable to tolerate Beta blockers due to hypotension. Is on Irbesartan low dose, but BP 80/50, asymptomatic. Difficult to cut irbesartan so will switch to losartan 25 mg and assess tolerance. Will recheck BP after 1 week of therapy in hypertension clinic.  4. Hyperlipidemia: atorvastatin reduced: From 80 to 20 mg due to myalgias in 03/2017.  Tolerating better. Follow-up lipid panel in 06/2017 showed LDL of 52 which is at goal of <70.  Continue current statin therapy  Disposition: Follow up with Dr. Burt Knack in 3 months and have BP checked in a week.    Medication Adjustments/Labs and Tests Ordered: Current medicines are reviewed at length with the patient today.  Concerns regarding medicines are outlined above. Labs and tests ordered and medication changes are outlined in the patient instructions below:  Patient Instructions  Medication Instructions:  1. STOP AVAPRO  2. START LOSARTAN 25 MG BY MOUTH ONCE DAILY. A PRESCRIPTION HAS BEEN SENT TO YOUR PHARMACY  Labwork: NONE ORDERED  Testing/Procedures: NONE ORDERED  Follow-Up: KEEP YOUR APPOINTMENT WITH DR. Burt Knack ON October 18, 2017 AT 9:40 AM  NEEDS AN APPOINTMENT WITH THE HYPERTENSION CLINIC NEXT WEEK  Any Other Special Instructions Will Be Listed Below (If Applicable).  If you need a refill on your cardiac medications before your next appointment, please call your pharmacy.    DASH Eating Plan DASH stands for "Dietary Approaches to Stop Hypertension." The DASH eating plan is a healthy eating plan that has been shown to reduce high blood pressure (hypertension). It may also reduce your risk for type 2 diabetes, heart disease, and stroke. The DASH eating plan may also help with weight loss. What  are tips for following this plan? General guidelines  Avoid eating more than 2,300 mg (milligrams) of salt (sodium) a day. If you have hypertension, you may need to reduce your sodium intake to 1,500 mg a day.  Limit alcohol intake to no more than 1 drink a day for nonpregnant women and 2 drinks a day for men. One drink equals 12 oz of beer, 5 oz of wine, or 1 oz of hard liquor.  Work with your health care provider to maintain a healthy body weight or to lose weight. Ask what an ideal weight is for you.  Get at least 30 minutes of exercise that causes your heart to beat faster (aerobic exercise) most days of the week. Activities may include walking, swimming, or biking.  Work with your health care provider or diet and nutrition specialist (dietitian) to adjust your eating plan to your individual calorie needs. Reading food labels  Check food labels for the amount of sodium per serving. Choose foods with less than 5 percent of the Daily Value of sodium. Generally, foods with less than 300 mg of sodium per serving fit into this eating plan.  To find whole grains, look for the word "whole" as the first word in the ingredient list. Shopping  Buy products labeled as "low-sodium" or "no salt added."  Buy fresh foods. Avoid canned foods and premade or frozen meals. Cooking  Avoid adding salt when cooking. Use salt-free seasonings or herbs instead of table salt or sea salt. Check with your health care provider or pharmacist before using salt substitutes.  Do not fry foods. Cook foods using healthy methods such as baking, boiling, grilling, and broiling instead.  Cook with heart-healthy oils, such as olive, canola, soybean, or sunflower oil. Meal planning  Eat a balanced diet that includes: ? 5 or more servings of fruits and vegetables each day. At each meal, try to fill half of your plate with fruits and vegetables. ? Up to 6-8 servings of whole grains each day. ? Less than 6 oz of lean  meat, poultry, or fish each day. A 3-oz serving of meat is about the same size as a deck of cards. One egg equals 1 oz. ? 2 servings of low-fat dairy each day. ? A serving of nuts, seeds, or beans 5 times each week. ? Heart-healthy fats. Healthy fats called Omega-3 fatty acids are found in foods such as flaxseeds and coldwater fish, like sardines, salmon, and mackerel.  Limit how much you eat of the following: ? Canned or prepackaged foods. ? Food that is high in trans fat, such as fried foods. ? Food that is high in saturated fat, such as fatty meat. ? Sweets, desserts, sugary drinks, and other foods with added sugar. ? Full-fat dairy products.  Do not salt foods before eating.  Try to eat at least 2 vegetarian meals each week.  Eat more home-cooked food and less restaurant, buffet, and fast food.  When eating at a restaurant, ask that your food be prepared with less salt or no salt, if possible. What foods are recommended? The items listed may not be a complete list. Talk with your dietitian about what dietary choices are best for you. Grains Whole-grain or whole-wheat bread. Whole-grain or whole-wheat pasta. Brown rice. [place] Morrow. Bulgur. Whole-grain and low-sodium cereals. Pita bread. Low-fat, low-sodium crackers. Whole-wheat flour tortillas. Vegetables Fresh or frozen vegetables (raw, steamed, roasted, or grilled). Low-sodium or reduced-sodium tomato and vegetable juice. Low-sodium or reduced-sodium tomato sauce and tomato paste. Low-sodium or reduced-sodium canned vegetables. Fruits All fresh, dried, or frozen fruit. Canned fruit in natural juice (without added sugar). Meat and other protein foods Skinless chicken or [place]. Ground chicken or [place]. Pork with fat trimmed off. Fish and seafood. Egg whites. Dried beans, peas, or lentils. Unsalted nuts, nut butters, and seeds. Unsalted canned beans. Lean cuts of beef with fat trimmed off. Low-sodium, lean deli  meat. Dairy Low-fat (1%) or fat-free (skim) milk. Fat-free, low-fat, or reduced-fat cheeses. Nonfat, low-sodium ricotta or cottage cheese. Low-fat or nonfat yogurt. Low-fat, low-sodium cheese. Fats and oils Soft margarine without trans fats. Vegetable oil. Low-fat, reduced-fat, or light mayonnaise and salad dressings (reduced-sodium). Canola, safflower, olive, soybean, and sunflower oils. Avocado. Seasoning and other foods Herbs. Spices. Seasoning mixes without salt. Unsalted popcorn and pretzels. Fat-free sweets. What foods are not recommended? The items listed may not be a complete list. Talk with your dietitian about what dietary choices are best for you. Grains Baked goods made with fat, such as croissants, muffins, or some breads. Dry pasta or rice meal packs. Vegetables Creamed or fried vegetables. Vegetables in a cheese sauce. Regular canned vegetables (not low-sodium or reduced-sodium). Regular canned tomato sauce and paste (not low-sodium or reduced-sodium). Regular tomato and vegetable juice (not low-sodium or reduced-sodium). Angie Fava. Olives. Fruits Canned fruit in a light or heavy syrup. Fried fruit. Fruit in cream or butter sauce. Meat and other protein foods Fatty cuts of meat. Ribs. Fried meat. Berniece Salines. Sausage. Bologna and other processed lunch meats. Salami. Fatback. Hotdogs. Bratwurst. Salted nuts and seeds. Canned beans with added salt. Canned or smoked fish. Whole eggs or egg yolks. Chicken or [place] with skin. Dairy Whole or 2% milk, cream, and half-and-half. Whole or full-fat cream cheese. Whole-fat or sweetened yogurt.  Full-fat cheese. Nondairy creamers. Whipped toppings. Processed cheese and cheese spreads. Fats and oils Butter. Stick margarine. Lard. Shortening. Ghee. Bacon fat. Tropical oils, such as coconut, palm kernel, or palm oil. Seasoning and other foods Salted popcorn and pretzels. Onion salt, garlic salt, seasoned salt, table salt, and sea salt. Worcestershire  sauce. Tartar sauce. Barbecue sauce. Teriyaki sauce. Soy sauce, including reduced-sodium. Steak sauce. Canned and packaged gravies. Fish sauce. Oyster sauce. Cocktail sauce. Horseradish that you find on the shelf. Ketchup. Mustard. Meat flavorings and tenderizers. Bouillon cubes. Hot sauce and Tabasco sauce. Premade or packaged marinades. Premade or packaged taco seasonings. Relishes. Regular salad dressings. Where to find more information:  National Heart, Lung, and [place]: https://wilson-eaton.com/  American Heart Association: www.heart.org Summary  The DASH eating plan is a healthy eating plan that has been shown to reduce high blood pressure (hypertension). It may also reduce your risk for type 2 diabetes, heart disease, and stroke.  With the DASH eating plan, you should limit salt (sodium) intake to 2,300 mg a day. If you have hypertension, you may need to reduce your sodium intake to 1,500 mg a day.  When on the DASH eating plan, aim to eat more fresh fruits and vegetables, whole grains, lean proteins, low-fat dairy, and heart-healthy fats.  Work with your health care provider or diet and nutrition specialist (dietitian) to adjust your eating plan to your individual calorie needs. This information is not intended to replace advice given to you by your health care provider. Make sure you discuss any questions you have with your health care provider. Document Released: 06/15/2011 Document Revised: 06/19/2016 Document Reviewed: 06/19/2016 Elsevier Interactive Patient Education  2018 Hunter, Daune Perch, NP  07/31/2017 2:14 PM    [facility]

## 2017-07-31 ENCOUNTER — Encounter (INDEPENDENT_AMBULATORY_CARE_PROVIDER_SITE_OTHER): Payer: Self-pay

## 2017-07-31 ENCOUNTER — Encounter: Payer: Self-pay | Admitting: Cardiology

## 2017-07-31 ENCOUNTER — Ambulatory Visit: Payer: PPO | Admitting: Cardiology

## 2017-07-31 VITALS — BP 80/50 | HR 78 | Ht 67.0 in | Wt 150.8 lb

## 2017-07-31 DIAGNOSIS — E785 Hyperlipidemia, unspecified: Secondary | ICD-10-CM

## 2017-07-31 DIAGNOSIS — I2510 Atherosclerotic heart disease of native coronary artery without angina pectoris: Secondary | ICD-10-CM | POA: Diagnosis not present

## 2017-07-31 DIAGNOSIS — R0789 Other chest pain: Secondary | ICD-10-CM

## 2017-07-31 DIAGNOSIS — I255 Ischemic cardiomyopathy: Secondary | ICD-10-CM | POA: Diagnosis not present

## 2017-07-31 MED ORDER — LOSARTAN POTASSIUM 25 MG PO TABS: 25.0000 mg | ORAL_TABLET | Freq: Every day | ORAL | 3 refills | Status: DC

## 2017-07-31 NOTE — Patient Instructions (Signed)
Medication Instructions:  1. STOP AVAPRO  2. START LOSARTAN 25 MG BY MOUTH ONCE DAILY. A PRESCRIPTION HAS BEEN SENT TO YOUR PHARMACY  Labwork: NONE ORDERED  Testing/Procedures: NONE ORDERED  Follow-Up: KEEP YOUR APPOINTMENT WITH DR. Burt Knack ON October 18, 2017 AT 9:40 AM  NEEDS AN APPOINTMENT WITH THE HYPERTENSION CLINIC NEXT WEEK  Any Other Special Instructions Will Be Listed Below (If Applicable).  If you need a refill on your cardiac medications before your next appointment, please call your pharmacy.    DASH Eating Plan DASH stands for "Dietary Approaches to Stop Hypertension." The DASH eating plan is a healthy eating plan that has been shown to reduce high blood pressure (hypertension). It may also reduce your risk for type 2 diabetes, heart disease, and stroke. The DASH eating plan may also help with weight loss. What are tips for following this plan? General guidelines  Avoid eating more than 2,300 mg (milligrams) of salt (sodium) a day. If you have hypertension, you may need to reduce your sodium intake to 1,500 mg a day.  Limit alcohol intake to no more than 1 drink a day for nonpregnant women and 2 drinks a day for men. One drink equals 12 oz of beer, 5 oz of wine, or 1 oz of hard liquor.  Work with your health care provider to maintain a healthy body weight or to lose weight. Ask what an ideal weight is for you.  Get at least 30 minutes of exercise that causes your heart to beat faster (aerobic exercise) most days of the week. Activities may include walking, swimming, or biking.  Work with your health care provider or diet and nutrition specialist (dietitian) to adjust your eating plan to your individual calorie needs. Reading food labels  Check food labels for the amount of sodium per serving. Choose foods with less than 5 percent of the Daily Value of sodium. Generally, foods with less than 300 mg of sodium per serving fit into this eating plan.  To find whole  grains, look for the word "whole" as the first word in the ingredient list. Shopping  Buy products labeled as "low-sodium" or "no salt added."  Buy fresh foods. Avoid canned foods and premade or frozen meals. Cooking  Avoid adding salt when cooking. Use salt-free seasonings or herbs instead of table salt or sea salt. Check with your health care provider or pharmacist before using salt substitutes.  Do not fry foods. Cook foods using healthy methods such as baking, boiling, grilling, and broiling instead.  Cook with heart-healthy oils, such as olive, canola, soybean, or sunflower oil. Meal planning   Eat a balanced diet that includes: ? 5 or more servings of fruits and vegetables each day. At each meal, try to fill half of your plate with fruits and vegetables. ? Up to 6-8 servings of whole grains each day. ? Less than 6 oz of lean meat, poultry, or fish each day. A 3-oz serving of meat is about the same size as a deck of cards. One egg equals 1 oz. ? 2 servings of low-fat dairy each day. ? A serving of nuts, seeds, or beans 5 times each week. ? Heart-healthy fats. Healthy fats called Omega-3 fatty acids are found in foods such as flaxseeds and coldwater fish, like sardines, salmon, and mackerel.  Limit how much you eat of the following: ? Canned or prepackaged foods. ? Food that is high in trans fat, such as fried foods. ? Food that is high in saturated  fat, such as fatty meat. ? Sweets, desserts, sugary drinks, and other foods with added sugar. ? Full-fat dairy products.  Do not salt foods before eating.  Try to eat at least 2 vegetarian meals each week.  Eat more home-cooked food and less restaurant, buffet, and fast food.  When eating at a restaurant, ask that your food be prepared with less salt or no salt, if possible. What foods are recommended? The items listed may not be a complete list. Talk with your dietitian about what dietary choices are best for  you. Grains Whole-grain or whole-wheat bread. Whole-grain or whole-wheat pasta. Brown rice. [place]. Bulgur. Whole-grain and low-sodium cereals. Pita bread. Low-fat, low-sodium crackers. Whole-wheat flour tortillas. Vegetables Fresh or frozen vegetables (raw, steamed, roasted, or grilled). Low-sodium or reduced-sodium tomato and vegetable juice. Low-sodium or reduced-sodium tomato sauce and tomato paste. Low-sodium or reduced-sodium canned vegetables. Fruits All fresh, dried, or frozen fruit. Canned fruit in natural juice (without added sugar). Meat and other protein foods Skinless chicken or [place]. Ground chicken or [place]. Pork with fat trimmed off. Fish and seafood. Egg whites. Dried beans, peas, or lentils. Unsalted nuts, nut butters, and seeds. Unsalted canned beans. Lean cuts of beef with fat trimmed off. Low-sodium, lean deli meat. Dairy Low-fat (1%) or fat-free (skim) milk. Fat-free, low-fat, or reduced-fat cheeses. Nonfat, low-sodium ricotta or cottage cheese. Low-fat or nonfat yogurt. Low-fat, low-sodium cheese. Fats and oils Soft margarine without trans fats. Vegetable oil. Low-fat, reduced-fat, or light mayonnaise and salad dressings (reduced-sodium). Canola, safflower, olive, soybean, and sunflower oils. Avocado. Seasoning and other foods Herbs. Spices. Seasoning mixes without salt. Unsalted popcorn and pretzels. Fat-free sweets. What foods are not recommended? The items listed may not be a complete list. Talk with your dietitian about what dietary choices are best for you. Grains Baked goods made with fat, such as croissants, muffins, or some breads. Dry pasta or rice meal packs. Vegetables Creamed or fried vegetables. Vegetables in a cheese sauce. Regular canned vegetables (not low-sodium or reduced-sodium). Regular canned tomato sauce and paste (not low-sodium or reduced-sodium). Regular tomato and vegetable juice (not low-sodium or reduced-sodium). Angie Fava.  Olives. Fruits Canned fruit in a light or heavy syrup. Fried fruit. Fruit in cream or butter sauce. Meat and other protein foods Fatty cuts of meat. Ribs. Fried meat. Berniece Salines. Sausage. Bologna and other processed lunch meats. Salami. Fatback. Hotdogs. Bratwurst. Salted nuts and seeds. Canned beans with added salt. Canned or smoked fish. Whole eggs or egg yolks. Chicken or [place] with skin. Dairy Whole or 2% milk, cream, and half-and-half. Whole or full-fat cream cheese. Whole-fat or sweetened yogurt. Full-fat cheese. Nondairy creamers. Whipped toppings. Processed cheese and cheese spreads. Fats and oils Butter. Stick margarine. Lard. Shortening. Ghee. Bacon fat. Tropical oils, such as coconut, palm kernel, or palm oil. Seasoning and other foods Salted popcorn and pretzels. Onion salt, garlic salt, seasoned salt, table salt, and sea salt. Worcestershire sauce. Tartar sauce. Barbecue sauce. Teriyaki sauce. Soy sauce, including reduced-sodium. Steak sauce. Canned and packaged gravies. Fish sauce. Oyster sauce. Cocktail sauce. Horseradish that you find on the shelf. Ketchup. Mustard. Meat flavorings and tenderizers. Bouillon cubes. Hot sauce and Tabasco sauce. Premade or packaged marinades. Premade or packaged taco seasonings. Relishes. Regular salad dressings. Where to find more information:  National Heart, Lung, and [place]: https://wilson-eaton.com/  American Heart Association: www.heart.org Summary  The DASH eating plan is a healthy eating plan that has been shown to reduce high blood pressure (hypertension). It may also reduce  your risk for type 2 diabetes, heart disease, and stroke.  With the DASH eating plan, you should limit salt (sodium) intake to 2,300 mg a day. If you have hypertension, you may need to reduce your sodium intake to 1,500 mg a day.  When on the DASH eating plan, aim to eat more fresh fruits and vegetables, whole grains, lean proteins, low-fat dairy, and heart-healthy  fats.  Work with your health care provider or diet and nutrition specialist (dietitian) to adjust your eating plan to your individual calorie needs. This information is not intended to replace advice given to you by your health care provider. Make sure you discuss any questions you have with your health care provider. Document Released: 06/15/2011 Document Revised: 06/19/2016 Document Reviewed: 06/19/2016 Elsevier Interactive Patient Education  [employer].

## 2017-08-13 ENCOUNTER — Ambulatory Visit: Payer: PPO

## 2017-08-13 NOTE — Progress Notes (Deleted)
Patient ID: Dustin Gardner                 DOB: 1946-05-06                      MRN: [id number]     HPI: Dustin Gardner is a 72 y.o. male patient of Dr. Burt Knack, referred by Dustin Newton, NP, who presents today for hypertension evaluation. PMH significant for CAD S/P anterior STEMI in 11/2016 treated with DES to proximal LAD.  EF at the time of cardiac catheterization was 35%.  Follow-up echocardiogram prior to discharge demonstrated EF 45-50% with anterior septal hypokinesis, mild diastolic dysfunction and mild mitral regurgitation.  At follow-up in 03/2017, he complained of fatigue in the setting of hypotension and his carvedilol was discontinued.  He also complained of myalgias in his atorvastatin was reduced to 20 mg. At his most recent OV his BP was 80/60 his irbesartan was changed to losartan 25mg  daily.   He presents today for blood pressure assessment.    Current HTN meds:  Terazosin 2mg  daily  Losartan 25mg  dialy   Previously tried:   BP goal: <130/80  Family History: Cancer in his mother; Heart disease in his brother and father  Social History:   Diet:   Exercise:   Home BP readings:   Wt Readings from Last 3 Encounters:  07/31/17 150 lb 12.8 oz (68.4 kg)  07/11/17 150 lb (68 kg)  07/06/17 150 lb 1.9 oz (68.1 kg)   BP Readings from Last 3 Encounters:  07/31/17 (!) 80/50  07/06/17 (!) 104/56  03/28/17 96/60   Pulse Readings from Last 3 Encounters:  07/31/17 78  07/06/17 68  03/28/17 80    Renal function: CrCl cannot be calculated (Patient's most recent lab result is older than the maximum 21 days allowed.).  Past Medical History:  Diagnosis Date  . Allergy   . BPH (benign prostatic hyperplasia)   . Chronic sinusitis   . Coronary artery disease involving native coronary artery of native heart without angina pectoris 07/05/2017   Status post anterior ST elevation myocardial infarction 5/18: LHC -proximal LAD 95; distal RCA 50; ostial RPDA 50; R PLB1 70 with >>  PCI: 3 x 20 mm Synergy DES to the proximal LAD // Nuclear stress test 1/19: EF 62, no infarct or ischemia, low risk   . History of ST elevation myocardial infarction (STEMI) 11/24/2016   Status post DES to the proximal LAD  . Hyperlipidemia with target LDL less than 70 07/05/2017   History of myalgias on higher dose atorvastatin  . Ischemic cardiomyopathy 07/05/2017   LHC 5/18: EF 35 with anterolateral and periapical hypokinesis //Echo 5/18: EF 45-50, anteroseptal HK, grade 1 diastolic dysfunction, mild MR, aortic root 38 mm, mildly dilated a sending aorta    Current Outpatient Medications on File Prior to Visit  Medication Sig Dispense Refill  . amphetamine-dextroamphetamine (ADDERALL) 10 MG tablet Take 5 mg by mouth 2 (two) times daily.    Marland Kitchen aspirin 81 MG chewable tablet Chew 1 tablet (81 mg total) by mouth daily. 90 tablet 3  . atorvastatin (LIPITOR) 20 MG tablet Take 1 tablet (20 mg total) by mouth daily at 6 PM. 90 tablet 3  . fluticasone (FLONASE) 50 MCG/ACT nasal spray Place 2 sprays into both nostrils daily.    Marland Kitchen loratadine (CLARITIN) 10 MG tablet Take 10 mg by mouth at bedtime.     Marland Kitchen losartan (COZAAR) 25 MG tablet Take 1  tablet (25 mg total) by mouth daily. 90 tablet 3  . nitroGLYCERIN (NITROSTAT) 0.4 MG SL tablet Place 1 tablet (0.4 mg total) under the tongue every 5 (five) minutes x 3 doses as needed for chest pain. 25 tablet 12  . terazosin (HYTRIN) 2 MG capsule Take 1 capsule (2 mg total) by mouth at bedtime. 30 capsule 12  . ticagrelor (BRILINTA) 90 MG TABS tablet Take 1 tablet (90 mg total) by mouth 2 (two) times daily. 180 tablet 3   No current facility-administered medications on file prior to visit.     Allergies  Allergen Reactions  . Wellbutrin [Bupropion Hcl] Rash    There were no vitals taken for this visit.   Assessment/Plan: Hypertension:    Thank you, Lelan Pons. Patterson Hammersmith, [facility]  08/13/2017 7:15 AM

## 2017-09-20 ENCOUNTER — Encounter: Payer: Self-pay | Admitting: Cardiovascular Disease

## 2017-10-15 DIAGNOSIS — I2510 Atherosclerotic heart disease of native coronary artery without angina pectoris: Secondary | ICD-10-CM | POA: Diagnosis not present

## 2017-10-15 DIAGNOSIS — N400 Enlarged prostate without lower urinary tract symptoms: Secondary | ICD-10-CM | POA: Diagnosis not present

## 2017-10-15 DIAGNOSIS — I252 Old myocardial infarction: Secondary | ICD-10-CM | POA: Diagnosis not present

## 2017-10-15 DIAGNOSIS — E782 Mixed hyperlipidemia: Secondary | ICD-10-CM | POA: Diagnosis not present

## 2017-10-15 DIAGNOSIS — Z6823 Body mass index (BMI) 23.0-23.9, adult: Secondary | ICD-10-CM | POA: Diagnosis not present

## 2017-10-15 DIAGNOSIS — J309 Allergic rhinitis, unspecified: Secondary | ICD-10-CM | POA: Diagnosis not present

## 2017-10-18 ENCOUNTER — Ambulatory Visit: Payer: PPO | Admitting: Cardiovascular Disease

## 2017-12-17 ENCOUNTER — Encounter (INDEPENDENT_AMBULATORY_CARE_PROVIDER_SITE_OTHER): Payer: Self-pay

## 2017-12-17 ENCOUNTER — Ambulatory Visit: Payer: PPO | Admitting: Cardiovascular Disease

## 2017-12-17 ENCOUNTER — Encounter: Payer: Self-pay | Admitting: Cardiovascular Disease

## 2017-12-17 VITALS — BP 102/68 | HR 69 | Ht 67.0 in | Wt 146.2 lb

## 2017-12-17 DIAGNOSIS — I2510 Atherosclerotic heart disease of native coronary artery without angina pectoris: Secondary | ICD-10-CM | POA: Diagnosis not present

## 2017-12-17 DIAGNOSIS — E782 Mixed hyperlipidemia: Secondary | ICD-10-CM

## 2017-12-17 NOTE — Progress Notes (Signed)
Cardiology Office Note Date:  12/17/2017   ID:  Dustin, Gardner 1946/05/30, MRN [id number]  PCP:  Fanny Bien, MD  Cardiologist:  Sherren Mocha, MD    Chief Complaint  Patient presents with  . Fatigue     History of Present Illness: Dustin Gardner is a 72 y.o. male with a past medical history significant for CAD S/P anterior STEMI in 11/2016 treated with DES to proximal LAD.  EF at the time of cardiac catheterization was 35%.  Follow-up echocardiogram prior to discharge demonstrated EF 45-50% with anterior septal hypokinesis, mild diastolic dysfunction and mild mitral regurgitation.  At follow-up in 03/2017, he complained of fatigue in the setting of hypotension and his carvedilol was discontinued.  He also complained of myalgias in his atorvastatin was reduced to 20 mg.  Follow-up lipids in December demonstrated optimal LDL at 52.  The patient is here for follow-up today. Nuclear stress test in January 2019  The patient is here alone today.  He is been doing fairly well.  He does complain of generalized fatigue and weakness especially in the mornings.  He stays active and has a place in [place] on the [address] [place].  He does some hiking, fishing, and kayaking.  He has no exertional symptoms.  Specifically denies chest pain, shortness of breath, or heart palpitations.  Past Medical History:  Diagnosis Date  . Allergy   . BPH (benign prostatic hyperplasia)   . Chronic sinusitis   . Coronary artery disease involving native coronary artery of native heart without angina pectoris 07/05/2017   Status post anterior ST elevation myocardial infarction 5/18: LHC -proximal LAD 95; distal RCA 50; ostial RPDA 50; R PLB1 70 with >> PCI: 3 x 20 mm Synergy DES to the proximal LAD // Nuclear stress test 1/19: EF 62, no infarct or ischemia, low risk   . History of ST elevation myocardial infarction (STEMI) 11/24/2016   Status post DES to the proximal LAD  . Hyperlipidemia with target LDL less than 70  07/05/2017   History of myalgias on higher dose atorvastatin  . Ischemic cardiomyopathy 07/05/2017   LHC 5/18: EF 35 with anterolateral and periapical hypokinesis //Echo 5/18: EF 45-50, anteroseptal HK, grade 1 diastolic dysfunction, mild MR, aortic root 38 mm, mildly dilated a sending aorta    Past Surgical History:  Procedure Laterality Date  . CORONARY/GRAFT ACUTE MI REVASCULARIZATION N/A 11/24/2016   Procedure: Coronary/Graft Acute MI Revascularization;  Surgeon: Sherren Mocha, MD;  Location: [facility];  Service: Cardiovascular;  Laterality: N/A;  . HERNIA REPAIR    . LEFT HEART CATH AND CORONARY ANGIOGRAPHY N/A 11/24/2016   Procedure: Left Heart Cath and Coronary Angiography;  Surgeon: Sherren Mocha, MD;  Location: [facility];  Service: Cardiovascular;  Laterality: N/A;    Current Outpatient Medications  Medication Sig Dispense Refill  . amphetamine-dextroamphetamine (ADDERALL) 10 MG tablet Take 5 mg by mouth 2 (two) times daily.    Marland Kitchen aspirin 81 MG chewable tablet Chew 1 tablet (81 mg total) by mouth daily. 90 tablet 3  . atorvastatin (LIPITOR) 20 MG tablet Take 1 tablet (20 mg total) by mouth daily at 6 PM. 90 tablet 3  . azelastine (ASTELIN) 0.1 % nasal spray Place 2 sprays into both nostrils as directed. Use in each nostril as directed    . loratadine (CLARITIN) 10 MG tablet Take 10 mg by mouth at bedtime.     . nitroGLYCERIN (NITROSTAT) 0.4 MG SL tablet Place  1 tablet (0.4 mg total) under the tongue every 5 (five) minutes x 3 doses as needed for chest pain. 25 tablet 12  . terazosin (HYTRIN) 2 MG capsule Take 1 capsule (2 mg total) by mouth at bedtime. 30 capsule 12   No current facility-administered medications for this visit.     Allergies:   Wellbutrin [bupropion hcl]   Social History:  The patient  reports that he has never smoked. He has never used smokeless tobacco. He reports that he drinks about 7.0 oz of alcohol per week. He reports that he does not  use drugs.   Family History:  The patient's family history includes Cancer in his mother; Heart disease in his brother and father.    ROS:  Please see the history of present illness. All other systems are reviewed and negative.    PHYSICAL EXAM: VS:  BP 102/68   Pulse 69   Ht 5\' 7"  (1.702 m)   Wt 146 lb 3.2 oz (66.3 kg)   SpO2 97%   BMI 22.90 kg/m  , BMI Body mass index is 22.9 kg/m. GEN: Well nourished, well developed, in no acute distress  HEENT: normal  Neck: no JVD, no masses. No carotid bruits Cardiac: RRR without murmur or gallop                Respiratory:  clear to auscultation bilaterally, normal work of breathing GI: soft, nontender, nondistended, + BS MS: no deformity or atrophy  Ext: no pretibial edema, pedal pulses 2+= bilaterally Skin: warm and dry, no rash Neuro:  Strength and sensation are intact Psych: euthymic mood, full affect  EKG:  EKG is not ordered today.  Recent Labs: 06/29/2017: ALT 37   Lipid Panel     Component Value Date/Time   CHOL 125 06/29/2017 0922   TRIG 64 06/29/2017 0922   HDL 60 06/29/2017 0922   CHOLHDL 2.1 06/29/2017 0922   CHOLHDL 3.5 11/25/2016 0226   VLDL 18 11/25/2016 0226   LDLCALC 52 06/29/2017 0922      Wt Readings from Last 3 Encounters:  12/17/17 146 lb 3.2 oz (66.3 kg)  07/31/17 150 lb 12.8 oz (68.4 kg)  07/11/17 150 lb (68 kg)     Cardiac Studies Reviewed: Myoview Scan 07-11-2017: Study Highlights     Nuclear stress EF: 62%.  Blood pressure demonstrated a normal response to exercise.  There was no ST segment deviation noted during stress.  The study is normal.  This is a low risk study.  The left ventricular ejection fraction is normal (55-65%).   Normal exercise nuclear stress test with no evidence for prior infarct or ischemia. Normal exercise tolerance. Normal BP response to exertion.    ASSESSMENT AND PLAN: 1.  CAD, native vessel, without angina: The patient appears clinically stable.  LV  function has completely normalized based on his nuclear perfusion scan in January 2019.  He should continue on aspirin for antiplatelet therapy and a statin drug for lipid lowering.  Based on guideline recommendations, he can discontinue ticagrelor now that he is greater than 12 months out from his MI.  We reviewed pros and cons of this today and he would like to minimize medicines as much as possible.  2.  Hyperlipidemia: Continue atorvastatin 20 mg daily.  Last lipids reviewed.  LDL is at goal of less than 70 mg/dL.  We will repeat lipids when he returns in 6 months.  Overall the patient is clinically stable.  With low blood pressure  and fatigue, I recommended that he discontinue losartan.  His blood pressure is running in the low range anyway with a systolic pressure of 793 this morning not having taken his morning medications yet.  Current medicines are reviewed with the patient today.  The patient does not have concerns regarding medicines.  Labs/ tests ordered today include:   Orders Placed This Encounter  Procedures  . Lipid panel  . Comprehensive metabolic panel    Disposition:   FU 6 months APP with lipids/CMet prior to the visit  Signed, Sherren Mocha, MD  12/17/2017 10:23 AM    [address], [place],   [address] Phone: [phone]; Fax: [phone]

## 2017-12-17 NOTE — Patient Instructions (Signed)
Medication Instructions:  1) STOP BRILINTA 2) STOP LOSARTAN  Labwork: Your provider recommends that you return for FASTING lab work prior to your appointment in 6 months.    Testing/Procedures: None ordered   Follow-Up: Your provider wants you to follow-up in: 6 months with Dr. Antionette Char assistant. You will receive a reminder letter in the mail two months in advance. If you don't receive a letter, please call our office to schedule the follow-up appointment.    Your provider wants you to follow-up in: 1 year with Dr. Burt Knack. You will receive a reminder letter in the mail two months in advance. If you don't receive a letter, please call our office to schedule the follow-up appointment.    Any Other Special Instructions Will Be Listed Below (If Applicable).     If you need a refill on your cardiac medications before your next appointment, please call your pharmacy.

## 2018-03-13 ENCOUNTER — Other Ambulatory Visit: Payer: Self-pay | Admitting: Cardiovascular Disease

## 2018-06-11 ENCOUNTER — Other Ambulatory Visit: Payer: Self-pay | Admitting: Cardiovascular Disease

## 2018-06-11 ENCOUNTER — Emergency Department (HOSPITAL_COMMUNITY): Payer: PPO

## 2018-06-11 ENCOUNTER — Other Ambulatory Visit: Payer: Self-pay

## 2018-06-11 ENCOUNTER — Encounter (HOSPITAL_COMMUNITY): Payer: Self-pay | Admitting: *Deleted

## 2018-06-11 ENCOUNTER — Emergency Department (HOSPITAL_COMMUNITY)
Admission: EM | Admit: 2018-06-11 | Discharge: 2018-06-11 | Disposition: A | Discharge status: Home / Self Care | Payer: PPO | Attending: Emergency Medicine | Admitting: Emergency Medicine

## 2018-06-11 DIAGNOSIS — Z7982 Long term (current) use of aspirin: Secondary | ICD-10-CM | POA: Insufficient documentation

## 2018-06-11 DIAGNOSIS — Z79899 Other long term (current) drug therapy: Secondary | ICD-10-CM | POA: Diagnosis not present

## 2018-06-11 DIAGNOSIS — R0902 Hypoxemia: Secondary | ICD-10-CM | POA: Diagnosis not present

## 2018-06-11 DIAGNOSIS — R079 Chest pain, unspecified: Secondary | ICD-10-CM | POA: Diagnosis not present

## 2018-06-11 DIAGNOSIS — R0602 Shortness of breath: Secondary | ICD-10-CM | POA: Diagnosis not present

## 2018-06-11 DIAGNOSIS — R0789 Other chest pain: Secondary | ICD-10-CM | POA: Diagnosis not present

## 2018-06-11 DIAGNOSIS — R202 Paresthesia of skin: Secondary | ICD-10-CM | POA: Diagnosis not present

## 2018-06-11 LAB — COMPREHENSIVE METABOLIC PANEL
ALT: 30 U/L (ref 0–44)
AST: 30 U/L (ref 15–41)
Albumin: 3.7 g/dL (ref 3.5–5.0)
Alkaline Phosphatase: 36 U/L — ABNORMAL LOW (ref 38–126)
Anion gap: 8 (ref 5–15)
BUN: 16 mg/dL (ref 8–23)
CO2: 24 mmol/L (ref 22–32)
Calcium: 8.9 mg/dL (ref 8.9–10.3)
Chloride: 104 mmol/L (ref 98–111)
Creatinine, Ser: 0.94 mg/dL (ref 0.61–1.24)
GFR calc Af Amer: >60 mL/min (ref >60)
GFR calc non Af Amer: >60 mL/min (ref >60)
Glucose, Bld: 99 mg/dL (ref 70–99)
Potassium: 4.4 mmol/L (ref 3.5–5.1)
Sodium: 136 mmol/L (ref 135–145)
Total Bilirubin: 0.7 mg/dL (ref 0.3–1.2)
Total Protein: 6 g/dL — ABNORMAL LOW (ref 6.5–8.1)

## 2018-06-11 LAB — CBC WITH DIFFERENTIAL/PLATELET
Abs Immature Granulocytes: 0.02 10*3/uL (ref 0.00–0.07)
Basophils Absolute: 0 10*3/uL (ref 0.0–0.1)
Basophils Relative: 1 %
EOS ABS: 0.2 10*3/uL (ref 0.0–0.5)
Eosinophils Relative: 3 %
HCT: 40 % (ref 39.0–52.0)
Hemoglobin: 13.3 g/dL (ref 13.0–17.0)
Immature Granulocytes: 0 %
Lymphocytes Relative: 24 %
Lymphs Abs: 1.3 10*3/uL (ref 0.7–4.0)
MCH: 31.5 pg (ref 26.0–34.0)
MCHC: 33.3 g/dL (ref 30.0–36.0)
MCV: 94.8 fL (ref 80.0–100.0)
Monocytes Absolute: 0.5 10*3/uL (ref 0.1–1.0)
Monocytes Relative: 9 %
Neutro Abs: 3.4 10*3/uL (ref 1.7–7.7)
Neutrophils Relative %: 63 %
Platelets: 197 10*3/uL (ref 150–400)
RBC: 4.22 MIL/uL (ref 4.22–5.81)
RDW: 12.6 % (ref 11.5–15.5)
WBC: 5.4 10*3/uL (ref 4.0–10.5)
nRBC: 0 % (ref 0.0–0.2)

## 2018-06-11 LAB — I-STAT TROPONIN, ED
Troponin i, poc: 0 ng/mL (ref 0.00–0.08)
Troponin i, poc: 0.01 ng/mL (ref 0.00–0.08)

## 2018-06-11 MED ORDER — ISOSORBIDE MONONITRATE ER 30 MG PO TB24: 30.0000 mg | ORAL_TABLET | Freq: Every day | ORAL | 0 refills | Status: AC

## 2018-06-11 NOTE — Discharge Instructions (Signed)
Take Imdur as directed.  Take it at night as this may cause you to have a slight headache and this will minimize the symptoms that you have.  Please follow-up with Dr. Burt Knack as previously scheduled.  Return to the Emergency Department immediately if you experiencing worsening chest pain, difficulty breathing, nausea/vomiting, get very sweaty, headache or any other worsening or concerning symptoms.

## 2018-06-11 NOTE — ED Triage Notes (Signed)
PT from home via EMS for CP that has been going on all day.  Pt reports he took  1 NGT at 0400  Pt unsure if NGT helped his CP. EMS gave 324 ASA .

## 2018-06-11 NOTE — ED Provider Notes (Signed)
[facility] EMERGENCY DEPARTMENT Provider Note   CSN: [id number] Arrival date & time: 06/11/18  1750     History   Chief Complaint Chief Complaint  Patient presents with  . Chest Pain    HPI Dustin Gardner is a 72 y.o. male with PMH/o CAD with MI in May 2018 with subsequent stenting, HLD brought in by EMS for evaluation of chest pain.  Patient reports that about 4 AM this morning, he started developing a pain and pressure in the left side of his chest.  He states that he took baby aspirin at home and did not have any improvement.  He reports that all throughout the day, he had this constant pain.  He called EMS who prompted him to take another dose of aspirin.  Additionally, he reports that he got 1 dose of nitro by EMS.  He states that prior to the nitro, his pain was a 3/10.  His pain is now a 2/10 after the nitro.  He states that this pain is been intermittently occurring for the last 2 months.  He states most the time it happens at night and will last a few minutes before going away.  He did try taking nitro for it at certain times and states that the nitro did not have any effect.  He states that he did not notice that the pain was worsened with eating.  He states that he has been able to do his normal activity and states that when he walks on treadmill, it does not bring about his pain.  He has no chest pain with deep inspiration.  He reports no associated shortness of breath, nausea/vomiting.  Patient reports that he has not seen his cardiologist for this pain and is scheduled to see them in the next week.  Patient is not currently on any blood thinners.  Patient denies any fevers, cough, congestion, difficulty breathing, abdominal pain, nausea/vomiting, numbness/weakness of his arms or legs.  Cardiology: Burt Knack   The history is provided by the patient.    Past Medical History:  Diagnosis Date  . Allergy   . BPH (benign prostatic hyperplasia)   . Chronic sinusitis     . Coronary artery disease involving native coronary artery of native heart without angina pectoris 07/05/2017   Status post anterior ST elevation myocardial infarction 5/18: LHC -proximal LAD 95; distal RCA 50; ostial RPDA 50; R PLB1 70 with >> PCI: 3 x 20 mm Synergy DES to the proximal LAD // Nuclear stress test 1/19: EF 62, no infarct or ischemia, low risk   . History of ST elevation myocardial infarction (STEMI) 11/24/2016   Status post DES to the proximal LAD  . Hyperlipidemia with target LDL less than 70 07/05/2017   History of myalgias on higher dose atorvastatin  . Ischemic cardiomyopathy 07/05/2017   LHC 5/18: EF 35 with anterolateral and periapical hypokinesis //Echo 5/18: EF 45-50, anteroseptal HK, grade 1 diastolic dysfunction, mild MR, aortic root 38 mm, mildly dilated a sending aorta    Patient Active Problem List   Diagnosis Date Noted  . Coronary artery disease involving native coronary artery of native heart without angina pectoris 07/05/2017  . Ischemic cardiomyopathy 07/05/2017  . Hyperlipidemia with target LDL less than 70 07/05/2017  . History of ST elevation myocardial infarction (STEMI) 11/24/2016    Past Surgical History:  Procedure Laterality Date  . CORONARY/GRAFT ACUTE MI REVASCULARIZATION N/A 11/24/2016   Procedure: Coronary/Graft Acute MI Revascularization;  Surgeon: Sherren Mocha,  MD;  Location: [facility];  Service: Cardiovascular;  Laterality: N/A;  . HERNIA REPAIR    . LEFT HEART CATH AND CORONARY ANGIOGRAPHY N/A 11/24/2016   Procedure: Left Heart Cath and Coronary Angiography;  Surgeon: Sherren Mocha, MD;  Location: [facility];  Service: Cardiovascular;  Laterality: N/A;        Home Medications    Prior to Admission medications   Medication Sig Start Date End Date Taking? Authorizing Provider  amphetamine-dextroamphetamine (ADDERALL) 10 MG tablet Take 5 mg by mouth 2 (two) times daily.    [provider]  aspirin 81 MG  chewable tablet Chew 1 tablet (81 mg total) by mouth daily. 11/27/16   Bhagat, Crista Luria, PA  atorvastatin (LIPITOR) 20 MG tablet TAKE 1 TABLET BY MOUTH ONCE DAILY AT 6PM 06/11/18   Sherren Mocha, MD  azelastine (ASTELIN) 0.1 % nasal spray Place 2 sprays into both nostrils as directed. Use in each nostril as directed    [provider]  isosorbide mononitrate (IMDUR) 30 MG 24 hr tablet Take 1 tablet (30 mg total) by mouth daily. 06/11/18   Volanda Napoleon, PA-C  loratadine (CLARITIN) 10 MG tablet Take 10 mg by mouth at bedtime.     [provider]  nitroGLYCERIN (NITROSTAT) 0.4 MG SL tablet Place 1 tablet (0.4 mg total) under the tongue every 5 (five) minutes x 3 doses as needed for chest pain. 11/26/16   Bhagat, Crista Luria, PA  terazosin (HYTRIN) 2 MG capsule Take 1 capsule (2 mg total) by mouth at bedtime. 09/02/14   Roselee Culver, MD    Family History Family History  Problem Relation Age of Onset  . Cancer Mother        lung  . Heart disease Father   . Heart disease Brother     Social History Social History   Tobacco Use  . Smoking status: Never Smoker  . Smokeless tobacco: Never Used  Substance Use Topics  . Alcohol use: Yes    Alcohol/week: 14.0 standard drinks    Types: 14 Standard drinks or equivalent per week  . Drug use: No     Allergies   Wellbutrin [bupropion hcl]   Review of Systems Review of Systems   Physical Exam Updated Vital Signs BP 122/76   Pulse 66   Resp 18   Ht 5\' 7"  (1.702 m)   Wt 64.4 kg   SpO2 96%   BMI 22.24 kg/m   Physical Exam  Constitutional: He is oriented to person, place, and time. He appears well-developed and well-nourished.  HENT:  Head: Normocephalic and atraumatic.  Mouth/Throat: Oropharynx is clear and moist and mucous membranes are normal.  Eyes: Pupils are equal, round, and reactive to light. Conjunctivae, EOM and lids are normal.  Neck: Full passive range of motion without pain.    Cardiovascular: Normal rate, regular rhythm, normal heart sounds and normal pulses. Exam reveals no gallop and no friction rub.  No murmur heard. Pulses:      Radial pulses are 2+ on the right side, and 2+ on the left side.       Dorsalis pedis pulses are 2+ on the right side, and 2+ on the left side.  Pulmonary/Chest: Effort normal and breath sounds normal.  Lungs clear to auscultation bilaterally.  Symmetric chest rise.  No wheezing, rales, rhonchi.  Abdominal: Soft. Normal appearance. There is no tenderness. There is no rigidity and no guarding.  Musculoskeletal: Normal range of motion.  Neurological:  He is alert and oriented to person, place, and time.  Cranial nerves III-XII intact Follows commands, Moves all extremities  5/5 strength to BUE and BLE  Sensation intact throughout all major nerve distributions Normal coordination No slurred speech. No facial droop.   Skin: Skin is warm and dry. Capillary refill takes less than 2 seconds.  Psychiatric: He has a normal mood and affect. His speech is normal.  Nursing note and vitals reviewed.    ED Treatments / Results  Labs (all labs ordered are listed, but only abnormal results are displayed) Labs Reviewed  COMPREHENSIVE METABOLIC PANEL - Abnormal; Notable for the following components:      Result Value   Total Protein 6.0 (*)    Alkaline Phosphatase 36 (*)    All other components within normal limits  CBC WITH DIFFERENTIAL/PLATELET  I-STAT TROPONIN, ED  I-STAT TROPONIN, ED  I-STAT TROPONIN, ED    EKG EKG Interpretation  Date/Time:  Tuesday June 11 2018 17:54:19 EST Ventricular Rate:  69 PR Interval:    QRS Duration: 141 QT Interval:  427 QTC Calculation: 458 R Axis:   70 Text Interpretation:  Sinus rhythm Right bundle branch block old RBBB. similar to previous  Confirmed by Charlesetta Shanks [phone]) on 06/11/2018 6:38:11 PM   Radiology Dg Chest 2 View  Result Date: 06/11/2018 CLINICAL DATA:  Chest tightness.   Shortness of breath. EXAM: CHEST - 2 VIEW COMPARISON:  September 16, 2011 FINDINGS: Hyperexpansion of the lungs. The heart, hila, mediastinum, lungs, and pleura are otherwise unremarkable. IMPRESSION: Hyperexpansion of the lungs suggesting COPD/emphysema. Recommend clinical correlation. No focal infiltrate or edema. Electronically Signed   By: Dorise Bullion III M.D   On: 06/11/2018 19:51    Procedures Procedures (including critical care time)  Medications Ordered in ED Medications - No data to display   Initial Impression / Assessment and Plan / ED Course  I have reviewed the triage vital signs and the nursing notes.  Pertinent labs & imaging results that were available during my care of the patient were reviewed by me and considered in my medical decision making (see chart for details).      72 y.o. M with PMH/o CAD with MI in May 2018 with subsequent stenting, HLD who presents for evaluation of chest pain.  He states he has been having intermittent episodes of chest pain for the last 2 months.  He states that today, he had an episode at 4 AM that lasted a bit longer.  He states that over the last 2 months, they have not been worse with exertion he has been able to do his 2 hours on his treadmill without any difficulty.  Not associated shortness breath, nausea/vomiting.  He came in today because he states last time he had a heart attack, he ignored his symptoms until it was too late and he wanted to make sure there was evaluated.  Had no recent sicknesses. Patient is afebrile, non-toxic appearing, sitting comfortably on examination table. Vital signs reviewed and stable.  Given his cardiac history, consider ACS etiology though given history, duration of symptoms, low suspicion.  Consider angina.  We will plan to check labs, EKG, chest x-ray.  Initial troponin negative.  CMP is unremarkable.  CBC without any significant leukocytosis or anemia.  Chest x-ray negative for any infectious  etiology.  Discussed patient with Dr. Paticia Stack (Cardiology).  Reassured by patient's EKG and troponin.  Recommends delta troponin given patient's risk factors.  Second troponin is negative, patient  can be discharged home with plans to follow-up with Dr. Burt Knack as previously scheduled.  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  Updated patient on plan.  He is agreeable.  Delta troponin negative.  Repeat vitals are stable.  Discussed results with patient.  Patient states he is feeling fine and is ready to go home. At this time, patient exhibits no emergent life-threatening condition that require further evaluation in ED.Marland Kitchen  Instructed him to start Imdur as directed by Dr. Paticia Stack.  Directed him to make sure he keeps his appointment Dr. Burt Knack. Patient had ample opportunity for questions and discussion. All patient's questions were answered with full understanding. Strict return precautions discussed. Patient expresses understanding and agreement to plan.    Final Clinical Impressions(s) / ED Diagnoses   Final diagnoses:  Nonspecific chest pain    ED Discharge Orders         Ordered    isosorbide mononitrate (IMDUR) 30 MG 24 hr tablet  Daily     06/11/18 2311           Desma Mcgregor 06/11/18 2323    Charlesetta Shanks, MD 06/13/18 2245

## 2018-06-13 DIAGNOSIS — F341 Dysthymic disorder: Secondary | ICD-10-CM | POA: Diagnosis not present

## 2018-06-13 DIAGNOSIS — F902 Attention-deficit hyperactivity disorder, combined type: Secondary | ICD-10-CM | POA: Diagnosis not present

## 2018-06-19 ENCOUNTER — Encounter: Payer: Self-pay | Admitting: Physician Assistant

## 2018-06-19 ENCOUNTER — Ambulatory Visit (INDEPENDENT_AMBULATORY_CARE_PROVIDER_SITE_OTHER): Payer: PPO | Admitting: Physician Assistant

## 2018-06-19 VITALS — BP 118/62 | HR 80 | Ht 67.0 in | Wt 145.0 lb

## 2018-06-19 DIAGNOSIS — E785 Hyperlipidemia, unspecified: Secondary | ICD-10-CM | POA: Diagnosis not present

## 2018-06-19 DIAGNOSIS — R0789 Other chest pain: Secondary | ICD-10-CM | POA: Diagnosis not present

## 2018-06-19 DIAGNOSIS — I2510 Atherosclerotic heart disease of native coronary artery without angina pectoris: Secondary | ICD-10-CM

## 2018-06-19 NOTE — Patient Instructions (Signed)
Medication Instructions:  Your physician recommends that you continue on your current medications as directed. Please refer to the Current Medication list given to you today.  If you need a refill on your cardiac medications before your next appointment, please call your pharmacy.   Lab work: None Ordered  If you have labs (blood work) drawn today and your tests are completely normal, you will receive your results only by: Marland Kitchen MyChart Message (if you have MyChart) OR . A paper copy in the mail If you have any lab test that is abnormal or we need to change your treatment, we will call you to review the results.  Testing/Procedures: None ordered  Follow-Up: At [facility], you and your health needs are our priority.  As part of our continuing mission to provide you with exceptional heart care, we have created designated Provider Care Teams.  These Care Teams include your primary Cardiologist (physician) and Advanced Practice Providers (APPs -  Physician Assistants and Nurse Practitioners) who all work together to provide you with the care you need, when you need it. . You will need a follow up appointment in 6 months.  Please call our office 2 months in advance to schedule this appointment.  You may see Dr. Burt Knack or one of the following Advanced Practice Providers on your designated Care Team:.   Any Other Special Instructions Will Be Listed Below (If Applicable).

## 2018-06-19 NOTE — Progress Notes (Signed)
Cardiology Office Note:    Date:  06/19/2018   ID:  Dustin, Gardner 1946/03/30, MRN [id number]  PCP:  Dustin Bien, MD  Cardiologist:  Dustin Mocha, MD  Electrophysiologist:  None   Referring MD: Dustin Bien, MD   Chief Complaint  Patient presents with  . Hospitalization Follow-up    ED visit for chest pain    History of Present Illness:    Dustin Gardner is a 72 y.o. male with CAD status post anterior ST elevation myocardial infarction in May 2018 treated with primary PCI using DES to the proximal LAD.  EF at time of cardiac catheterization was 35%.  Follow-up echocardiogram prior to discharge demonstrated EF 45-50% with anteroseptal hypokinesis and mild diastolic dysfunction and mild mitral regurgitation.  Nuclear stress test in January 2019 was negative for infarct or ischemia.  He was last seen by Dr. Burt Knack in June 2019.    The patient was seen in the emergency room 06/11/2018 with chest discomfort.  ECG did not demonstrate any acute findings.  Troponin was negative x2.  His case was discussed with the cardiology fellow who recommended isosorbide 30 mg daily and follow-up as an outpatient.  [name] returns for follow-up.  His chest discomfort that prompted his visit to the emergency room had progressively worsened over several weeks.  It was fairly constant on the day he went to the emergency room.  He describes it as a pressure.  He does not have any radiating symptoms or associated symptoms.  He has had recurrent chest symptoms since going to the emergency room.  He had some last night that resolved after changing positions and taking a nitroglycerin.  He did try isosorbide, but had to stop the medication due to his significant headache.  He does workout on a regular basis.  He denies exertional chest discomfort.  He does lift weights quite a bit and wonders if some of his symptoms may be related to chest soreness.  Prior CV studies:   The following studies were  reviewed today:  Nuclear stress test 07/11/17 Normal exercise nuclear stress test with no evidence for prior infarct or ischemia. Normal exercise tolerance. Normal BP response to exertion.  EF 62  AAA duplex 8/18 Normal caliber abdominal aorta, common and external iliac arteries without focal dilatation; aorto-iliac atherosclerosis without stenosis  Echocardiogram 5/18 EF 45-50, anteroseptal HK, grade 1 diastolic dysfunction, mild MR, aortic root 38 mm, mildly dilated a sending aorta  Cardiac catheterization 5/18 LAD proximal 95-thrombotic LCx mild disease RCA distal 50; ostial RPDA 50; RPLB1 49 EF 35, anterolateral and periapical HK PCI: 3 x 20 mm Synergy DES to the proximal LAD  Past Medical History:  Diagnosis Date  . Allergy   . BPH (benign prostatic hyperplasia)   . Chronic sinusitis   . Coronary artery disease involving native coronary artery of native heart without angina pectoris 07/05/2017   Status post anterior ST elevation myocardial infarction 5/18: LHC -proximal LAD 95; distal RCA 50; ostial RPDA 50; R PLB1 70 with >> PCI: 3 x 20 mm Synergy DES to the proximal LAD // Nuclear stress test 1/19: EF 62, no infarct or ischemia, low risk   . History of ST elevation myocardial infarction (STEMI) 11/24/2016   Status post DES to the proximal LAD  . Hyperlipidemia with target LDL less than 70 07/05/2017   History of myalgias on higher dose atorvastatin  . Ischemic cardiomyopathy 07/05/2017   LHC 5/18: EF 35 with anterolateral  and periapical hypokinesis //Echo 5/18: EF 45-50, anteroseptal HK, grade 1 diastolic dysfunction, mild MR, aortic root 38 mm, mildly dilated a sending aorta   Surgical Hx: The patient  has a past surgical history that includes Hernia repair; LEFT HEART CATH AND CORONARY ANGIOGRAPHY (N/A, 11/24/2016); and Coronary/Graft Acute MI Revascularization (N/A, 11/24/2016).   Current Medications: Current Meds  Medication Sig  . amphetamine-dextroamphetamine  (ADDERALL) 10 MG tablet Take 5 mg by mouth 2 (two) times daily.  Marland Kitchen aspirin 81 MG chewable tablet Chew 1 tablet (81 mg total) by mouth daily.  Marland Kitchen atorvastatin (LIPITOR) 20 MG tablet TAKE 1 TABLET BY MOUTH ONCE DAILY AT 6PM  . fluticasone (FLONASE) 50 MCG/ACT nasal spray Place 2 sprays into both nostrils daily.  . isosorbide mononitrate (IMDUR) 30 MG 24 hr tablet Take 1 tablet (30 mg total) by mouth daily.  Marland Kitchen loratadine (CLARITIN) 10 MG tablet Take 10 mg by mouth at bedtime.   . nitroGLYCERIN (NITROSTAT) 0.4 MG SL tablet Place 1 tablet (0.4 mg total) under the tongue every 5 (five) minutes x 3 doses as needed for chest pain.  Marland Kitchen terazosin (HYTRIN) 2 MG capsule Take 1 capsule (2 mg total) by mouth at bedtime.  . [DISCONTINUED] azelastine (ASTELIN) 0.1 % nasal spray Place 2 sprays into both nostrils as directed. Use in each nostril as directed     Allergies:   Wellbutrin [bupropion hcl]   Social History   Tobacco Use  . Smoking status: Never Smoker  . Smokeless tobacco: Never Used  Substance Use Topics  . Alcohol use: Yes    Alcohol/week: 14.0 standard drinks    Types: 14 Standard drinks or equivalent per week  . Drug use: No     Family Hx: The patient's family history includes Cancer in his mother; Heart disease in his brother and father.  ROS:   Please see the history of present illness.    Review of Systems  Cardiovascular: Positive for chest pain.   All other systems reviewed and are negative.   EKGs/Labs/Other Test Reviewed:    EKG:  EKG is  ordered today.  The ekg ordered today demonstrates  normal sinus rhythm, heart rate 69, normal axis, right bundle branch block, QTC 445, no change from prior tracing  Recent Labs: 06/11/2018: ALT 30; BUN 16; Creatinine, Ser 0.94; Hemoglobin 13.3; Platelets 197; Potassium 4.4; Sodium 136   Recent Lipid Panel Lab Results  Component Value Date/Time   CHOL 125 06/29/2017 09:22 AM   TRIG 64 06/29/2017 09:22 AM   HDL 60 06/29/2017 09:22 AM     CHOLHDL 2.1 06/29/2017 09:22 AM   CHOLHDL 3.5 11/25/2016 02:26 AM   LDLCALC 52 06/29/2017 09:22 AM    Physical Exam:    VS:  BP 118/62   Pulse 80   Ht 5\' 7"  (1.702 m)   Wt 145 lb (65.8 kg)   SpO2 98%   BMI 22.71 kg/m     Wt Readings from Last 3 Encounters:  06/19/18 145 lb (65.8 kg)  06/11/18 142 lb (64.4 kg)  12/17/17 146 lb 3.2 oz (66.3 kg)     Physical Exam  Constitutional: He is oriented to person, place, and time. He appears well-developed and well-nourished. No distress.  HENT:  Head: Normocephalic and atraumatic.  Neck: Neck supple. No JVD present. No thyromegaly present.  Cardiovascular: Normal rate, regular rhythm, S1 normal and S2 normal.  No murmur heard. Pulmonary/Chest: Breath sounds normal. He has no rales.  Abdominal: Soft. There is no  hepatomegaly.  Musculoskeletal: He exhibits no edema.  Lymphadenopathy:    He has no cervical adenopathy.  Neurological: He is alert and oriented to person, place, and time.  Skin: Skin is warm and dry.  Psychiatric: He has a normal mood and affect.    ASSESSMENT & PLAN:    Other chest pain Etiology of his symptoms are not entirely clear.  He did have some residual disease at his cardiac catheterization in May 2018 with 50% ostial RPDA stenosis, distal 50% RCA stenosis and 70% RPL B1 stenosis.  His symptoms are not quite like his previous angina.  However, they do remind him somewhat of this.  He has taken nitroglycerin in the past with questionable resolution.  He does not have exertional symptoms.  His ECG is unchanged.  He did have a nuclear stress test in the last 12 months that was low risk.  With continued symptoms, a recent unremarkable stress test and his inability to take most antianginal medications, I have recommended proceeding with cardiac catheterization to better evaluate his symptoms.  He would like to consider this and let us know if he would like to proceed.  If he decides to proceed with cardiac  catheterization, I will try to arrange his procedure with Dr. Burt Knack.  Coronary artery disease involving native coronary artery of native heart without angina pectoris History of anterior ST elevation myocardial infarction in May 2018 treated with a drug-eluting stent to the LAD.  As noted, the patient continues to have occasional chest discomfort.  He has been intolerant to antianginal medications in the past.  His blood pressure tends to run low.  I am not convinced that adding ranolazine is warranted at this time.  As noted, if he decides to proceed with cardiac catheterization he will contact us.  Continue aspirin, statin.  Hyperlipidemia with target LDL less than 70 LDL optimal on most recent lab work.  Continue current Rx.       Dispo:  Return in about 6 months (around 12/19/2018) for Routine Follow Up, w/ Dr. Burt Knack.   Medication Adjustments/Labs and Tests Ordered: Current medicines are reviewed at length with the patient today.  Concerns regarding medicines are outlined above.  Tests Ordered: Orders Placed This Encounter  Procedures  . EKG 12-Lead   Medication Changes: No orders of the defined types were placed in this encounter.   Signed, Richardson Dopp, PA-C  06/19/2018 10:56 AM    [facility] [place], [place]  [address] Phone: [phone]; Fax: [phone]

## 2018-08-09 ENCOUNTER — Other Ambulatory Visit: Payer: Self-pay

## 2018-08-09 NOTE — Patient Outreach (Signed)
[facility]) Care Management  08/09/2018  CELVIN TANEY 08/07/1945 [id number]   Medication Adherence call to Mr. Jarvin Ogren patient did not answer left a message for patient to cal back patient is due on Irbesartan 75 mg under [employer].   [facility] Management Direct Dial [phone]  Fax [phone] [email or web address]

## 2018-08-20 ENCOUNTER — Other Ambulatory Visit: Payer: Self-pay

## 2018-08-20 NOTE — Patient Outreach (Signed)
[facility]) Care Management  08/20/2018  MUSTAFA POTTS 31-Oct-1945 [id number]   Medication Adherence call to Mr. Bartlomiej Jenkinson Telephone call to Patient regarding Medication Adherence unable to reach patient HIPPA Compliant Voice message left with a call back number.   [facility] Management Direct Dial [phone]  Fax [phone] [email or web address]

## 2018-08-27 DIAGNOSIS — Z0000 Encounter for general adult medical examination without abnormal findings: Secondary | ICD-10-CM | POA: Diagnosis not present

## 2018-08-27 DIAGNOSIS — Z6823 Body mass index (BMI) 23.0-23.9, adult: Secondary | ICD-10-CM | POA: Diagnosis not present

## 2018-08-27 DIAGNOSIS — Z9189 Other specified personal risk factors, not elsewhere classified: Secondary | ICD-10-CM | POA: Diagnosis not present

## 2018-08-27 DIAGNOSIS — Z125 Encounter for screening for malignant neoplasm of prostate: Secondary | ICD-10-CM | POA: Diagnosis not present

## 2018-08-27 DIAGNOSIS — Z1211 Encounter for screening for malignant neoplasm of colon: Secondary | ICD-10-CM | POA: Diagnosis not present

## 2018-08-27 DIAGNOSIS — I10 Essential (primary) hypertension: Secondary | ICD-10-CM | POA: Diagnosis not present

## 2018-11-07 DIAGNOSIS — N529 Male erectile dysfunction, unspecified: Secondary | ICD-10-CM | POA: Diagnosis not present

## 2018-11-07 DIAGNOSIS — I2510 Atherosclerotic heart disease of native coronary artery without angina pectoris: Secondary | ICD-10-CM | POA: Diagnosis not present

## 2018-11-07 DIAGNOSIS — N400 Enlarged prostate without lower urinary tract symptoms: Secondary | ICD-10-CM | POA: Diagnosis not present

## 2018-11-07 DIAGNOSIS — Z719 Counseling, unspecified: Secondary | ICD-10-CM | POA: Diagnosis not present

## 2018-12-05 DIAGNOSIS — N400 Enlarged prostate without lower urinary tract symptoms: Secondary | ICD-10-CM | POA: Diagnosis not present

## 2018-12-05 DIAGNOSIS — N529 Male erectile dysfunction, unspecified: Secondary | ICD-10-CM | POA: Diagnosis not present

## 2018-12-05 DIAGNOSIS — I252 Old myocardial infarction: Secondary | ICD-10-CM | POA: Diagnosis not present

## 2018-12-05 DIAGNOSIS — I10 Essential (primary) hypertension: Secondary | ICD-10-CM | POA: Diagnosis not present

## 2018-12-06 ENCOUNTER — Other Ambulatory Visit: Payer: Self-pay | Admitting: *Deleted

## 2018-12-06 ENCOUNTER — Encounter: Payer: Self-pay | Admitting: *Deleted

## 2018-12-06 NOTE — Patient Outreach (Addendum)
[facility]) Care Management  12/06/2018  ABIR CRAINE 1945/10/12 [id number]   Telephone Screen  Referral Date: 12/06/18   Referral Source: MD referral  Referral Reason: evaluation and treatment, case worker assist him in getting a blood pressure cuff please Margot Ables a MD office  Insurance: [employer] ([employer]A)     Outreach attempt # 1 to his home number Patient is able to verify HIPAA Reviewed and addressed referral to [facility] with patient  Mr Kessinger confirms he had a telehealth conference call with Dr Ernie Hew on 12/05/18 and this resulted in [facility] referral for assistance with a BP cuff. Mr Lippman reports Dr Ernie Hew voiced concern with his new combination of HTN medicines possibly leading to hypotension s/s. He is aware of need for his systolic BP to remain above 100.  [facility] RN CM discussed also maintenance of diastolic BP above 60 and reporting lows to MD He voiced understanding   He reports he feels he has his home care managed well   Social:   Mr Thoma reports he has support at home from his domestic partner/companion, Sonia Baller. He reports being independent with all his care needs. 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 services with patient. Patient gave verbal consent for services [facility] telephonic RN CM.  He denies need of services from [facility]/Telephonic RN CM, pharmacy, health coach, NP or SW at this  time  Plan: [place] CM will have [place] send Mr Strength a blood pressure monitor in the mail. Discussed with Mr Mahany [facility] DME services ( no guarantee of charges) He voices understanding [facility] RN CM will close case at this time as patient has been assessed and no needs identified/needs resolved.   Pt encouraged to return a call to [place] CM prn  [facility] RN CM sent a successful outreach letter as discussed with [facility] brochure enclosed for review   Routed note to MD  [place]. Lavina Hamman, RN, BSN, [place] Coordinator Office number [phone] Mobile number [phone]  Main [facility] number [phone] Fax number [phone]

## 2019-02-10 ENCOUNTER — Other Ambulatory Visit: Payer: Self-pay

## 2019-03-25 DIAGNOSIS — N529 Male erectile dysfunction, unspecified: Secondary | ICD-10-CM | POA: Diagnosis not present

## 2019-03-25 DIAGNOSIS — J309 Allergic rhinitis, unspecified: Secondary | ICD-10-CM | POA: Diagnosis not present

## 2019-03-25 DIAGNOSIS — I252 Old myocardial infarction: Secondary | ICD-10-CM | POA: Diagnosis not present

## 2019-03-25 DIAGNOSIS — I10 Essential (primary) hypertension: Secondary | ICD-10-CM | POA: Diagnosis not present

## 2019-03-25 DIAGNOSIS — E782 Mixed hyperlipidemia: Secondary | ICD-10-CM | POA: Diagnosis not present

## 2019-03-25 DIAGNOSIS — N400 Enlarged prostate without lower urinary tract symptoms: Secondary | ICD-10-CM | POA: Diagnosis not present

## 2019-03-25 DIAGNOSIS — I2510 Atherosclerotic heart disease of native coronary artery without angina pectoris: Secondary | ICD-10-CM | POA: Diagnosis not present

## 2019-06-09 DIAGNOSIS — D225 Melanocytic nevi of trunk: Secondary | ICD-10-CM | POA: Diagnosis not present

## 2019-06-09 DIAGNOSIS — D229 Melanocytic nevi, unspecified: Secondary | ICD-10-CM | POA: Diagnosis not present

## 2019-06-09 DIAGNOSIS — B351 Tinea unguium: Secondary | ICD-10-CM | POA: Diagnosis not present

## 2019-06-09 DIAGNOSIS — C44519 Basal cell carcinoma of skin of other part of trunk: Secondary | ICD-10-CM | POA: Diagnosis not present

## 2019-06-09 DIAGNOSIS — C4491 Basal cell carcinoma of skin, unspecified: Secondary | ICD-10-CM

## 2019-06-09 HISTORY — DX: Basal cell carcinoma of skin, unspecified: C44.91

## 2019-06-12 DIAGNOSIS — F341 Dysthymic disorder: Secondary | ICD-10-CM | POA: Diagnosis not present

## 2019-06-12 DIAGNOSIS — F902 Attention-deficit hyperactivity disorder, combined type: Secondary | ICD-10-CM | POA: Diagnosis not present

## 2019-06-27 DIAGNOSIS — C44519 Basal cell carcinoma of skin of other part of trunk: Secondary | ICD-10-CM | POA: Diagnosis not present

## 2019-09-10 ENCOUNTER — Encounter: Payer: Self-pay | Admitting: *Deleted

## 2019-09-26 ENCOUNTER — Other Ambulatory Visit: Payer: Self-pay

## 2019-09-26 ENCOUNTER — Ambulatory Visit: Payer: PPO | Admitting: Physician Assistant

## 2019-09-26 ENCOUNTER — Encounter: Payer: Self-pay | Admitting: Physician Assistant

## 2019-09-26 DIAGNOSIS — Z85828 Personal history of other malignant neoplasm of skin: Secondary | ICD-10-CM

## 2019-09-26 DIAGNOSIS — C44519 Basal cell carcinoma of skin of other part of trunk: Secondary | ICD-10-CM

## 2019-09-26 NOTE — Progress Notes (Addendum)
   Follow up Visit  Subjective  Dustin Gardner is a 74 y.o. male who presents for the following: Follow-up (right upper back Basal cell Carcinoma- Tx on 06/27/19- no problems or concerns per patient.).  Objective  Well appearing patient in no apparent distress; mood and affect are within normal limits.  All skin waist up examined. No suspicious moles noted on back.   Objective  Right upper back. Surgical scar examined no sign of recurrence.    Assessment & Plan  Basal cell carcinoma (BCC) of skin of other part of torso Right upper back.

## 2019-10-07 DIAGNOSIS — Z0389 Encounter for observation for other suspected diseases and conditions ruled out: Secondary | ICD-10-CM | POA: Diagnosis not present

## 2019-10-07 DIAGNOSIS — I2510 Atherosclerotic heart disease of native coronary artery without angina pectoris: Secondary | ICD-10-CM | POA: Diagnosis not present

## 2019-10-07 DIAGNOSIS — Z6822 Body mass index (BMI) 22.0-22.9, adult: Secondary | ICD-10-CM | POA: Diagnosis not present

## 2019-10-07 DIAGNOSIS — E7800 Pure hypercholesterolemia, unspecified: Secondary | ICD-10-CM | POA: Diagnosis not present

## 2019-10-07 DIAGNOSIS — J3089 Other allergic rhinitis: Secondary | ICD-10-CM | POA: Diagnosis not present

## 2019-10-07 DIAGNOSIS — Z0000 Encounter for general adult medical examination without abnormal findings: Secondary | ICD-10-CM | POA: Diagnosis not present

## 2019-10-07 DIAGNOSIS — R351 Nocturia: Secondary | ICD-10-CM | POA: Diagnosis not present

## 2019-11-04 IMAGING — NM NM MISC PROCEDURE
3 series · 18 of 18 positions shown · non-contrast
Comparison: none

[Series 1: stress-sum-em_(id)_sa · 6.4mm · 6.40mm/px · 6 of 64 frames shown]
[frame 6/64]
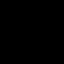
[frame 16/64]
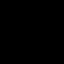
[frame 27/64]
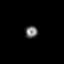
[frame 38/64]
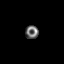
[frame 48/64]
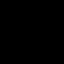
[frame 59/64]
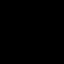

[Series 1: stress-gsp_(id)_sa · 6.4mm · 6.40mm/px · 6 of 512 frames shown]
[frame 43/512]
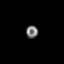
[frame 128/512]
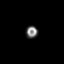
[frame 214/512]
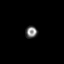
[frame 299/512]
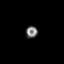
[frame 384/512]
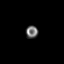
[frame 470/512]
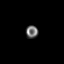

[Series 1: rest_(id)_sa · 6.4mm · 6.40mm/px · 6 of 64 frames shown]
[frame 6/64]
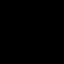
[frame 16/64]
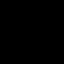
[frame 27/64]
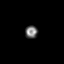
[frame 38/64]
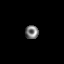
[frame 48/64]
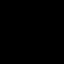
[frame 59/64]
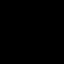

[18 of 18 positions shown; findings below may reference images not displayed]

Canned report from images found in remote index.

Refer to host system for actual result text.

## 2020-01-06 DIAGNOSIS — I2510 Atherosclerotic heart disease of native coronary artery without angina pectoris: Secondary | ICD-10-CM | POA: Diagnosis not present

## 2020-04-26 ENCOUNTER — Ambulatory Visit: Payer: PPO | Admitting: Physician Assistant

## 2020-04-27 ENCOUNTER — Encounter: Payer: Self-pay | Admitting: General Practice

## 2020-05-05 DIAGNOSIS — I2510 Atherosclerotic heart disease of native coronary artery without angina pectoris: Secondary | ICD-10-CM | POA: Diagnosis not present

## 2020-05-05 DIAGNOSIS — Z23 Encounter for immunization: Secondary | ICD-10-CM | POA: Diagnosis not present

## 2020-05-21 ENCOUNTER — Telehealth: Payer: Self-pay | Admitting: Cardiovascular Disease

## 2020-05-21 ENCOUNTER — Encounter: Payer: Self-pay | Admitting: General Practice

## 2020-05-21 NOTE — Telephone Encounter (Signed)
  Recall expunge letter sent 

## 2020-06-10 DIAGNOSIS — F341 Dysthymic disorder: Secondary | ICD-10-CM | POA: Diagnosis not present

## 2020-06-10 DIAGNOSIS — F902 Attention-deficit hyperactivity disorder, combined type: Secondary | ICD-10-CM | POA: Diagnosis not present

## 2020-10-04 IMAGING — CR DG CHEST 2V
2 series · 2 of 2 positions shown · non-contrast
Comparison: September 16, 2011

CLINICAL DATA: Chest tightness.  Shortness of breath.

EXAM:
CHEST - 2 VIEW

[chest pa]
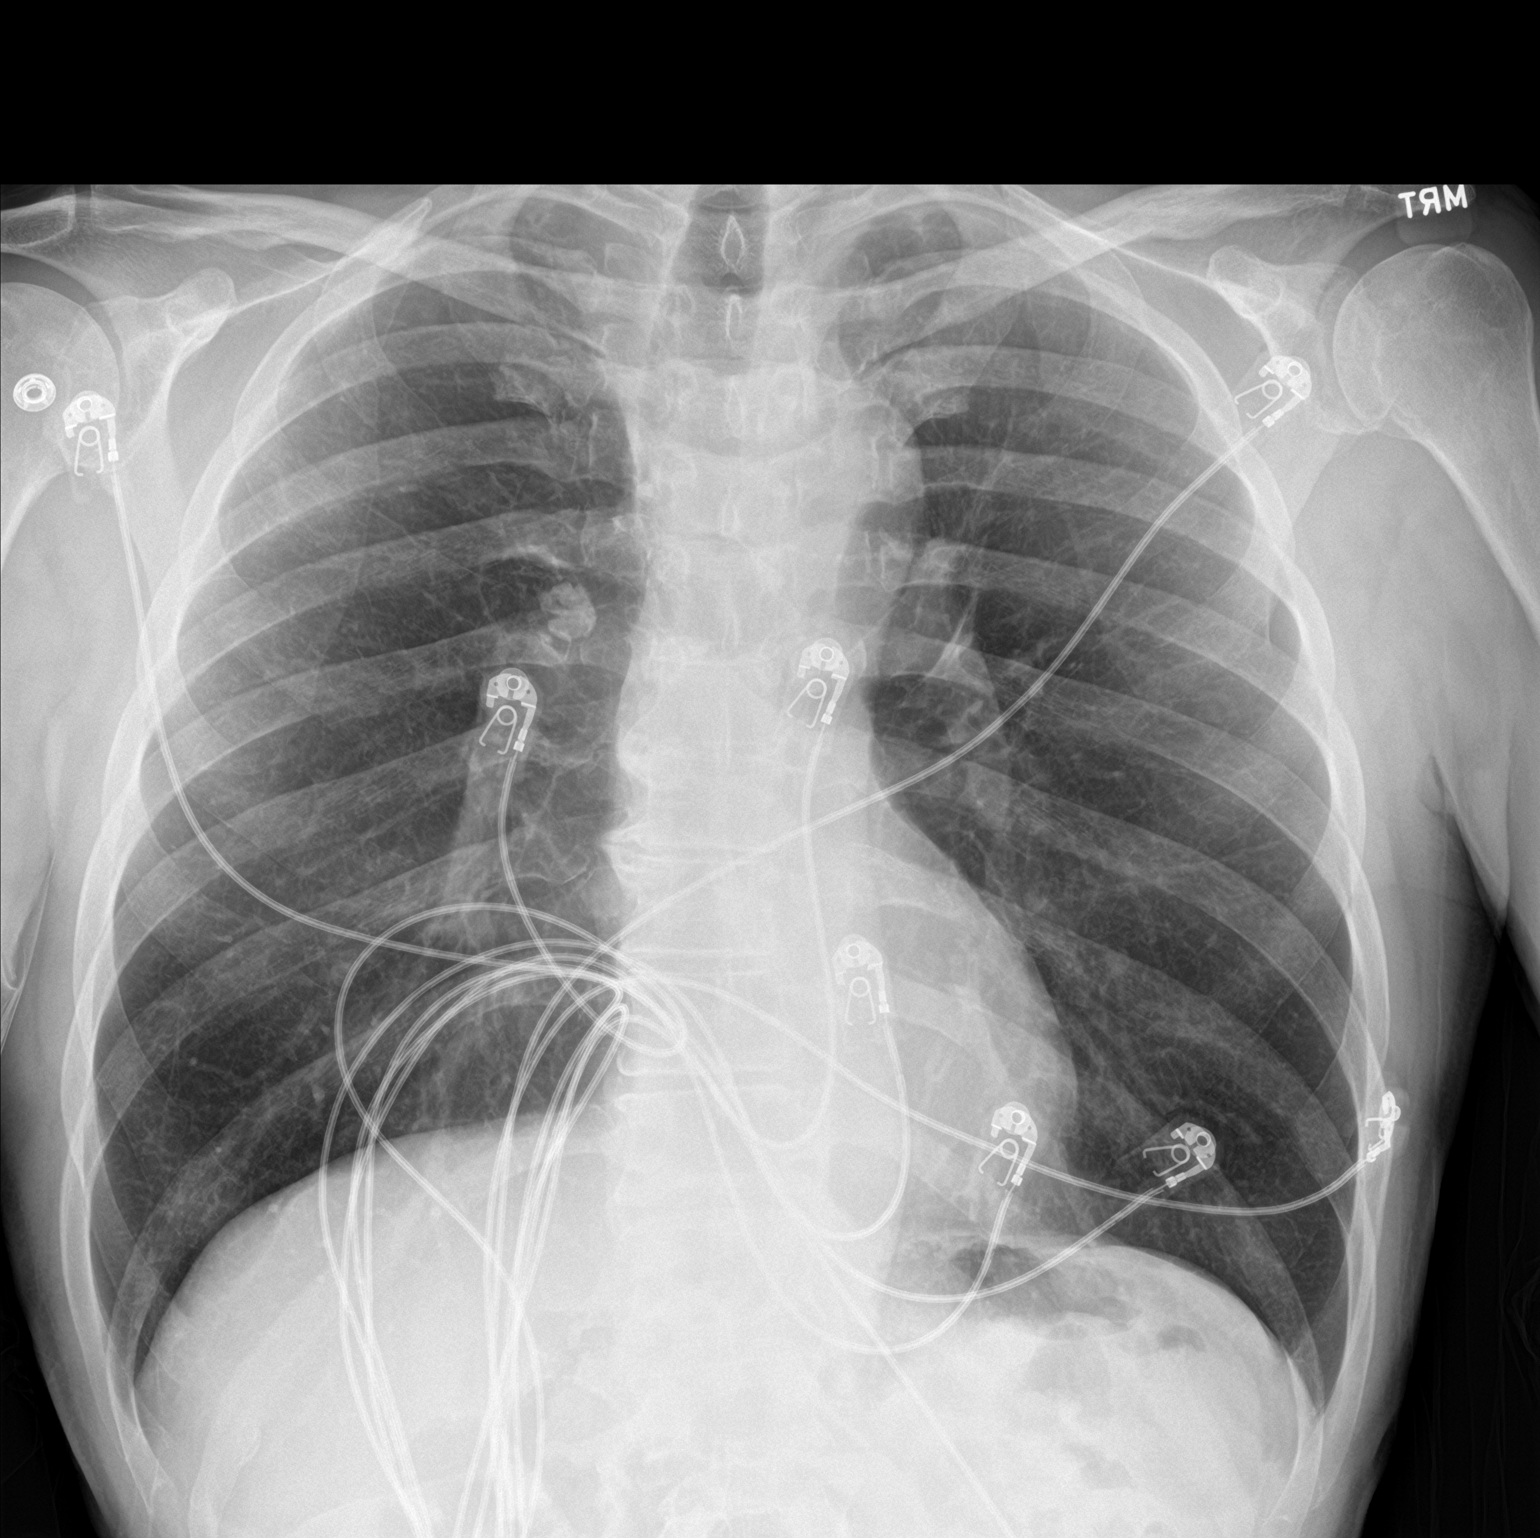

[chest lat]
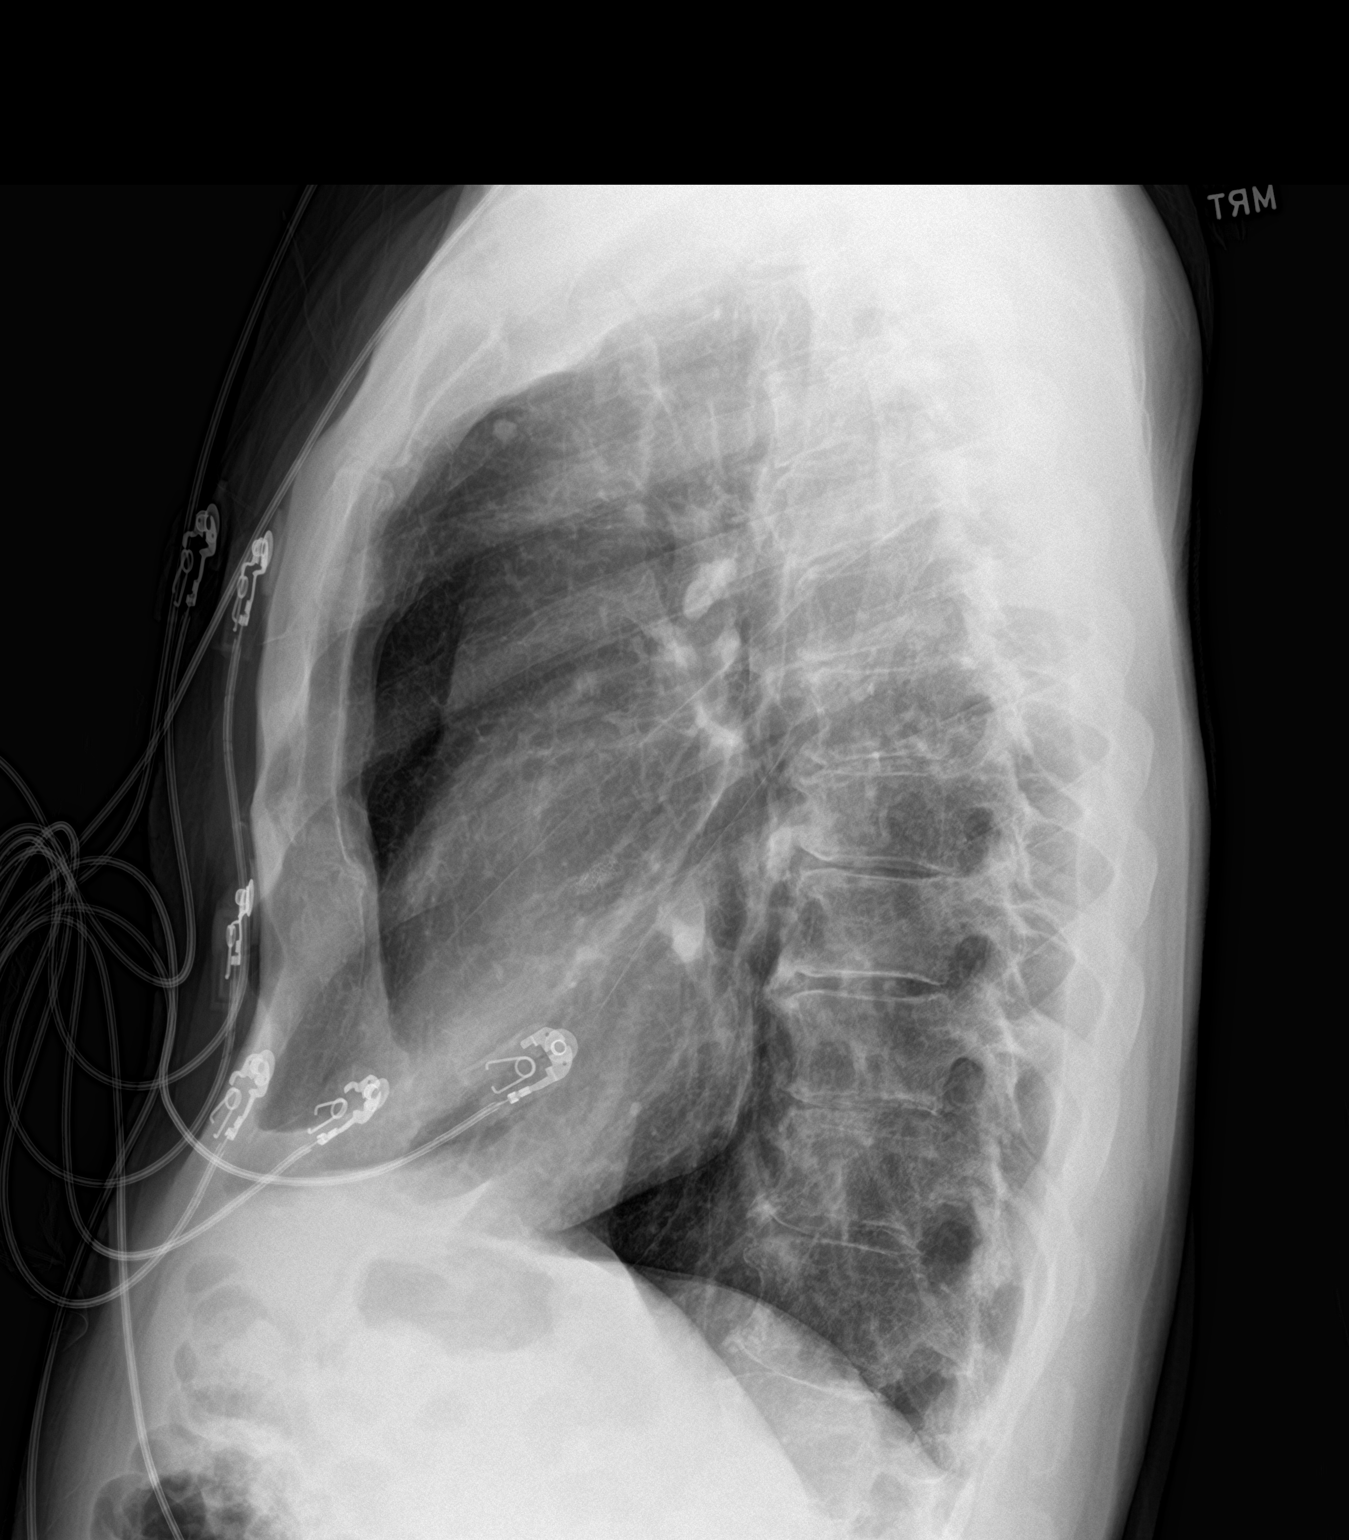

[2 of 2 positions shown; findings below may reference images not displayed]

FINDINGS: Hyperexpansion of the lungs. The heart, hila, mediastinum, lungs,
and pleura are otherwise unremarkable.
IMPRESSION: Hyperexpansion of the lungs suggesting COPD/emphysema. Recommend
clinical correlation. No focal infiltrate or edema.

## 2020-10-13 DIAGNOSIS — Z0000 Encounter for general adult medical examination without abnormal findings: Secondary | ICD-10-CM | POA: Diagnosis not present

## 2020-10-13 DIAGNOSIS — I2510 Atherosclerotic heart disease of native coronary artery without angina pectoris: Secondary | ICD-10-CM | POA: Diagnosis not present

## 2020-10-13 DIAGNOSIS — R351 Nocturia: Secondary | ICD-10-CM | POA: Diagnosis not present

## 2020-10-13 DIAGNOSIS — I4510 Unspecified right bundle-branch block: Secondary | ICD-10-CM | POA: Diagnosis not present

## 2020-12-09 DIAGNOSIS — F341 Dysthymic disorder: Secondary | ICD-10-CM | POA: Diagnosis not present

## 2020-12-09 DIAGNOSIS — F902 Attention-deficit hyperactivity disorder, combined type: Secondary | ICD-10-CM | POA: Diagnosis not present

## 2021-04-26 DIAGNOSIS — I2510 Atherosclerotic heart disease of native coronary artery without angina pectoris: Secondary | ICD-10-CM | POA: Diagnosis not present

## 2021-04-26 DIAGNOSIS — F901 Attention-deficit hyperactivity disorder, predominantly hyperactive type: Secondary | ICD-10-CM | POA: Diagnosis not present

## 2021-04-26 DIAGNOSIS — Z5181 Encounter for therapeutic drug level monitoring: Secondary | ICD-10-CM | POA: Diagnosis not present

## 2021-06-09 DIAGNOSIS — F341 Dysthymic disorder: Secondary | ICD-10-CM | POA: Diagnosis not present

## 2021-06-09 DIAGNOSIS — F902 Attention-deficit hyperactivity disorder, combined type: Secondary | ICD-10-CM | POA: Diagnosis not present

## 2021-08-29 DIAGNOSIS — I2510 Atherosclerotic heart disease of native coronary artery without angina pectoris: Secondary | ICD-10-CM | POA: Diagnosis not present

## 2021-08-29 DIAGNOSIS — Z5181 Encounter for therapeutic drug level monitoring: Secondary | ICD-10-CM | POA: Diagnosis not present

## 2021-12-22 DIAGNOSIS — F902 Attention-deficit hyperactivity disorder, combined type: Secondary | ICD-10-CM | POA: Diagnosis not present

## 2021-12-22 DIAGNOSIS — F341 Dysthymic disorder: Secondary | ICD-10-CM | POA: Diagnosis not present

## 2022-06-09 DIAGNOSIS — R051 Acute cough: Secondary | ICD-10-CM | POA: Diagnosis not present

## 2022-06-09 DIAGNOSIS — Z1331 Encounter for screening for depression: Secondary | ICD-10-CM | POA: Diagnosis not present

## 2022-06-09 DIAGNOSIS — R059 Cough, unspecified: Secondary | ICD-10-CM | POA: Diagnosis not present

## 2022-06-09 DIAGNOSIS — I2510 Atherosclerotic heart disease of native coronary artery without angina pectoris: Secondary | ICD-10-CM | POA: Diagnosis not present

## 2022-06-09 DIAGNOSIS — Z23 Encounter for immunization: Secondary | ICD-10-CM | POA: Diagnosis not present

## 2022-06-09 DIAGNOSIS — R351 Nocturia: Secondary | ICD-10-CM | POA: Diagnosis not present

## 2022-06-09 DIAGNOSIS — Z0000 Encounter for general adult medical examination without abnormal findings: Secondary | ICD-10-CM | POA: Diagnosis not present

## 2022-09-19 DIAGNOSIS — F902 Attention-deficit hyperactivity disorder, combined type: Secondary | ICD-10-CM | POA: Diagnosis not present

## 2022-09-19 DIAGNOSIS — F341 Dysthymic disorder: Secondary | ICD-10-CM | POA: Diagnosis not present

## 2022-12-12 DIAGNOSIS — I2510 Atherosclerotic heart disease of native coronary artery without angina pectoris: Secondary | ICD-10-CM | POA: Diagnosis not present

## 2023-03-22 DIAGNOSIS — F902 Attention-deficit hyperactivity disorder, combined type: Secondary | ICD-10-CM | POA: Diagnosis not present

## 2023-03-22 DIAGNOSIS — F341 Dysthymic disorder: Secondary | ICD-10-CM | POA: Diagnosis not present

## 2023-06-19 DIAGNOSIS — Z125 Encounter for screening for malignant neoplasm of prostate: Secondary | ICD-10-CM | POA: Diagnosis not present

## 2023-06-19 DIAGNOSIS — I2510 Atherosclerotic heart disease of native coronary artery without angina pectoris: Secondary | ICD-10-CM | POA: Diagnosis not present

## 2023-06-19 DIAGNOSIS — Z1331 Encounter for screening for depression: Secondary | ICD-10-CM | POA: Diagnosis not present

## 2023-06-19 DIAGNOSIS — Z0000 Encounter for general adult medical examination without abnormal findings: Secondary | ICD-10-CM | POA: Diagnosis not present

## 2023-06-19 DIAGNOSIS — Z6822 Body mass index (BMI) 22.0-22.9, adult: Secondary | ICD-10-CM | POA: Diagnosis not present

## 2023-06-19 DIAGNOSIS — R351 Nocturia: Secondary | ICD-10-CM | POA: Diagnosis not present

## 2023-12-24 DIAGNOSIS — I2510 Atherosclerotic heart disease of native coronary artery without angina pectoris: Secondary | ICD-10-CM | POA: Diagnosis not present

## 2023-12-24 DIAGNOSIS — Z5181 Encounter for therapeutic drug level monitoring: Secondary | ICD-10-CM | POA: Diagnosis not present

## 2023-12-24 DIAGNOSIS — Z79899 Other long term (current) drug therapy: Secondary | ICD-10-CM | POA: Diagnosis not present

## 2024-04-06 DIAGNOSIS — I4510 Unspecified right bundle-branch block: Secondary | ICD-10-CM | POA: Diagnosis not present

## 2024-04-06 DIAGNOSIS — I080 Rheumatic disorders of both mitral and aortic valves: Secondary | ICD-10-CM | POA: Diagnosis not present

## 2024-04-06 DIAGNOSIS — Z79899 Other long term (current) drug therapy: Secondary | ICD-10-CM | POA: Diagnosis not present

## 2024-04-06 DIAGNOSIS — R9431 Abnormal electrocardiogram [ECG] [EKG]: Secondary | ICD-10-CM | POA: Diagnosis not present

## 2024-04-06 DIAGNOSIS — R072 Precordial pain: Secondary | ICD-10-CM | POA: Diagnosis not present

## 2024-04-06 DIAGNOSIS — R911 Solitary pulmonary nodule: Secondary | ICD-10-CM | POA: Diagnosis not present

## 2024-04-06 DIAGNOSIS — I4581 Long QT syndrome: Secondary | ICD-10-CM | POA: Diagnosis not present

## 2024-04-06 DIAGNOSIS — I2109 ST elevation (STEMI) myocardial infarction involving other coronary artery of anterior wall: Secondary | ICD-10-CM | POA: Diagnosis not present

## 2024-04-06 DIAGNOSIS — I77810 Thoracic aortic ectasia: Secondary | ICD-10-CM | POA: Diagnosis not present

## 2024-04-06 DIAGNOSIS — I2510 Atherosclerotic heart disease of native coronary artery without angina pectoris: Secondary | ICD-10-CM | POA: Diagnosis not present

## 2024-04-06 DIAGNOSIS — R0789 Other chest pain: Secondary | ICD-10-CM | POA: Diagnosis not present

## 2024-04-06 DIAGNOSIS — Z8679 Personal history of other diseases of the circulatory system: Secondary | ICD-10-CM | POA: Diagnosis not present

## 2024-04-06 DIAGNOSIS — I5021 Acute systolic (congestive) heart failure: Secondary | ICD-10-CM | POA: Diagnosis not present

## 2024-04-06 DIAGNOSIS — I951 Orthostatic hypotension: Secondary | ICD-10-CM | POA: Diagnosis not present

## 2024-04-06 DIAGNOSIS — I213 ST elevation (STEMI) myocardial infarction of unspecified site: Secondary | ICD-10-CM | POA: Diagnosis not present

## 2024-04-06 DIAGNOSIS — Z7982 Long term (current) use of aspirin: Secondary | ICD-10-CM | POA: Diagnosis not present

## 2024-04-06 DIAGNOSIS — Z955 Presence of coronary angioplasty implant and graft: Secondary | ICD-10-CM | POA: Diagnosis not present

## 2024-04-06 DIAGNOSIS — I2102 ST elevation (STEMI) myocardial infarction involving left anterior descending coronary artery: Secondary | ICD-10-CM | POA: Diagnosis not present

## 2024-04-06 DIAGNOSIS — I219 Acute myocardial infarction, unspecified: Secondary | ICD-10-CM | POA: Diagnosis not present

## 2024-04-06 DIAGNOSIS — Z9282 Status post administration of tPA (rtPA) in a different facility within the last 24 hours prior to admission to current facility: Secondary | ICD-10-CM | POA: Diagnosis not present

## 2024-04-06 DIAGNOSIS — R001 Bradycardia, unspecified: Secondary | ICD-10-CM | POA: Diagnosis not present

## 2024-04-06 DIAGNOSIS — R918 Other nonspecific abnormal finding of lung field: Secondary | ICD-10-CM | POA: Diagnosis not present

## 2024-04-06 DIAGNOSIS — F419 Anxiety disorder, unspecified: Secondary | ICD-10-CM | POA: Diagnosis not present

## 2024-04-06 DIAGNOSIS — J329 Chronic sinusitis, unspecified: Secondary | ICD-10-CM | POA: Diagnosis not present

## 2024-04-06 DIAGNOSIS — R059 Cough, unspecified: Secondary | ICD-10-CM | POA: Diagnosis not present

## 2024-04-06 DIAGNOSIS — Z9861 Coronary angioplasty status: Secondary | ICD-10-CM | POA: Diagnosis not present

## 2024-04-06 DIAGNOSIS — N400 Enlarged prostate without lower urinary tract symptoms: Secondary | ICD-10-CM | POA: Diagnosis not present

## 2024-04-06 DIAGNOSIS — Z7902 Long term (current) use of antithrombotics/antiplatelets: Secondary | ICD-10-CM | POA: Diagnosis not present

## 2024-04-06 DIAGNOSIS — R079 Chest pain, unspecified: Secondary | ICD-10-CM | POA: Diagnosis not present

## 2024-04-07 DIAGNOSIS — I77810 Thoracic aortic ectasia: Secondary | ICD-10-CM | POA: Diagnosis not present

## 2024-04-07 DIAGNOSIS — I080 Rheumatic disorders of both mitral and aortic valves: Secondary | ICD-10-CM | POA: Diagnosis not present

## 2024-04-08 DIAGNOSIS — I2109 ST elevation (STEMI) myocardial infarction involving other coronary artery of anterior wall: Secondary | ICD-10-CM | POA: Diagnosis not present

## 2024-04-11 ENCOUNTER — Encounter (HOSPITAL_COMMUNITY): Payer: Self-pay

## 2024-04-11 ENCOUNTER — Telehealth (HOSPITAL_COMMUNITY): Payer: Self-pay

## 2024-04-11 NOTE — Telephone Encounter (Signed)
 Patient called back regarding cardiac rehab. Patient states he is not interested in cardiac rehab at this time as he exercises a lot on his own. Patient asked if we could create a referral for him to see Dr. Wonda for cardiology, informed patient he will need to call Dr. Margurite office to see if a referral is required for him to schedule an appt.  Closing referral.

## 2024-04-11 NOTE — Telephone Encounter (Signed)
 Outside/paper referral received by Dr. Anabel from [facility]. Will fax over request for EKG when patient confirms interest. Insurance benefits and eligibility to be determined.   Attempted to call patient regarding interest in cardiac rehab- no answer, left message. Sent letter.

## 2024-04-15 DIAGNOSIS — F902 Attention-deficit hyperactivity disorder, combined type: Secondary | ICD-10-CM | POA: Diagnosis not present

## 2024-04-15 DIAGNOSIS — F341 Dysthymic disorder: Secondary | ICD-10-CM | POA: Diagnosis not present

## 2024-04-15 DIAGNOSIS — F419 Anxiety disorder, unspecified: Secondary | ICD-10-CM | POA: Diagnosis not present

## 2024-04-23 DIAGNOSIS — N401 Enlarged prostate with lower urinary tract symptoms: Secondary | ICD-10-CM | POA: Diagnosis not present

## 2024-04-23 DIAGNOSIS — I2510 Atherosclerotic heart disease of native coronary artery without angina pectoris: Secondary | ICD-10-CM | POA: Diagnosis not present

## 2024-04-23 DIAGNOSIS — I2109 ST elevation (STEMI) myocardial infarction involving other coronary artery of anterior wall: Secondary | ICD-10-CM | POA: Diagnosis not present

## 2024-04-23 DIAGNOSIS — I5020 Unspecified systolic (congestive) heart failure: Secondary | ICD-10-CM | POA: Diagnosis not present

## 2024-05-28 DIAGNOSIS — I5020 Unspecified systolic (congestive) heart failure: Secondary | ICD-10-CM | POA: Diagnosis not present
